# Patient Record
Sex: Female | Born: 1950 | Race: White | Marital: Married | Smoking: Never smoker
Health system: Southern US, Community
[De-identification: ages and names within clinical notes are randomized; demographics above are authoritative.]

## PROBLEM LIST (undated history)

## (undated) DIAGNOSIS — E7800 Pure hypercholesterolemia, unspecified: Secondary | ICD-10-CM

## (undated) DIAGNOSIS — F419 Anxiety disorder, unspecified: Secondary | ICD-10-CM

## (undated) DIAGNOSIS — F32A Depression, unspecified: Secondary | ICD-10-CM

## (undated) DIAGNOSIS — F329 Major depressive disorder, single episode, unspecified: Secondary | ICD-10-CM

## (undated) DIAGNOSIS — K219 Gastro-esophageal reflux disease without esophagitis: Secondary | ICD-10-CM

## (undated) HISTORY — DX: Anxiety disorder, unspecified: F41.9

## (undated) HISTORY — PX: BREAST LUMPECTOMY: SHX2

## (undated) HISTORY — DX: Major depressive disorder, single episode, unspecified: F32.9

## (undated) HISTORY — DX: Pure hypercholesterolemia, unspecified: E78.00

## (undated) HISTORY — PX: COLONOSCOPY: SHX174

## (undated) HISTORY — PX: BREAST CYST EXCISION: SHX579

## (undated) HISTORY — PX: BREAST EXCISIONAL BIOPSY: SUR124

## (undated) HISTORY — DX: Depression, unspecified: F32.A

## (undated) HISTORY — DX: Gastro-esophageal reflux disease without esophagitis: K21.9

## (undated) HISTORY — PX: HEEL SPUR SURGERY: SHX665

---

## 2007-07-14 ENCOUNTER — Ambulatory Visit: Payer: Self-pay | Admitting: Family Medicine

## 2011-10-28 LAB — HM HEPATITIS C SCREENING LAB: HM Hepatitis Screen: NEGATIVE

## 2011-10-29 ENCOUNTER — Ambulatory Visit: Payer: Self-pay | Admitting: Family Medicine

## 2011-10-30 ENCOUNTER — Ambulatory Visit: Payer: Self-pay | Admitting: Family Medicine

## 2012-09-16 ENCOUNTER — Ambulatory Visit: Payer: Self-pay | Admitting: Gastroenterology

## 2012-09-16 HISTORY — PX: UPPER GI ENDOSCOPY: SHX6162

## 2012-09-16 LAB — HM COLONOSCOPY: HM COLON: NORMAL

## 2012-09-17 LAB — PATHOLOGY REPORT

## 2013-01-12 ENCOUNTER — Ambulatory Visit: Payer: Self-pay | Admitting: Family Medicine

## 2013-04-06 ENCOUNTER — Ambulatory Visit: Payer: Self-pay | Admitting: Podiatry

## 2013-04-20 ENCOUNTER — Ambulatory Visit (INDEPENDENT_AMBULATORY_CARE_PROVIDER_SITE_OTHER): Payer: BC Managed Care – PPO | Admitting: Podiatry

## 2013-04-20 ENCOUNTER — Encounter: Payer: Self-pay | Admitting: Podiatry

## 2013-04-20 ENCOUNTER — Ambulatory Visit (INDEPENDENT_AMBULATORY_CARE_PROVIDER_SITE_OTHER): Payer: BC Managed Care – PPO

## 2013-04-20 VITALS — BP 106/66 | HR 61 | Resp 16 | Ht 61.0 in | Wt 111.0 lb

## 2013-04-20 DIAGNOSIS — M79671 Pain in right foot: Secondary | ICD-10-CM

## 2013-04-20 DIAGNOSIS — M79609 Pain in unspecified limb: Secondary | ICD-10-CM

## 2013-04-20 DIAGNOSIS — M2040 Other hammer toe(s) (acquired), unspecified foot: Secondary | ICD-10-CM

## 2013-04-20 NOTE — Progress Notes (Signed)
   Subjective:    Patient ID: Melanie Palmer, female    DOB: Nov 01, 1950, 62 y.o.   MRN: [id number]  HPI Comments: N toenails are falling off and bruising , hammer toes  L all toenails have fallen off, growing back , b/l  #2 toes  D started exercising on treadmill in march  O gradual  C better  A T`went up a shoes size      Review of Systems  HENT:       Sinus problems   Skin:       Change in nails   All other systems reviewed and are negative.       Objective:   Physical Exam: I have reviewed her past medical history medications and allergies. Vital signs are stable she is alert and oriented x3. Vascular evaluation demonstrates strong palpable pulses bilateral. Deep tendon reflexes are intact bilateral. Neurologic sensorium is intact bilateral. Orthopedic evaluation does demonstrate short stubby hammertoe deformities. Cutaneous evaluation does demonstrate nail dystrophies in nail avulsions associated with trauma.       Assessment & Plan:  Assessment: Hammertoe deformity is resulting in nail dystrophies and onychomycosis.  Plan: We discussed appropriate size shoe gear and nail trimming options. I will followup with her on an as-needed basis.

## 2014-06-01 ENCOUNTER — Ambulatory Visit: Payer: Self-pay | Admitting: Family Medicine

## 2014-07-11 LAB — TSH: TSH: 2.58 u[IU]/mL (ref 0.41–5.90)

## 2014-07-11 LAB — HEPATIC FUNCTION PANEL
ALT: 14 U/L (ref 7–35)
AST: 15 U/L (ref 13–35)
Alkaline Phosphatase: 90 U/L (ref 25–125)
Bilirubin, Total: 0.3 mg/dL

## 2014-07-11 LAB — LIPID PANEL
Cholesterol: 248 mg/dL — AB (ref 0–200)
HDL: 52 mg/dL (ref 35–70)
LDL Cholesterol: 166 mg/dL
LDL/HDL RATIO: 3.2
Triglycerides: 152 mg/dL (ref 40–160)

## 2014-07-11 LAB — BASIC METABOLIC PANEL
BUN: 12 mg/dL (ref 4–21)
Creatinine: 0.8 mg/dL (ref 0.5–1.1)
GLUCOSE: 98 mg/dL
POTASSIUM: 4.1 mmol/L (ref 3.4–5.3)
SODIUM: 143 mmol/L (ref 137–147)

## 2014-07-11 LAB — CBC AND DIFFERENTIAL
HEMATOCRIT: 37 % (ref 36–46)
Hemoglobin: 12.8 g/dL (ref 12.0–16.0)
NEUTROS ABS: 2 /uL
Platelets: 294 10*3/uL (ref 150–399)
WBC: 4.5 10^3/mL

## 2015-05-03 DIAGNOSIS — F330 Major depressive disorder, recurrent, mild: Secondary | ICD-10-CM | POA: Insufficient documentation

## 2015-05-03 DIAGNOSIS — F411 Generalized anxiety disorder: Secondary | ICD-10-CM | POA: Insufficient documentation

## 2015-05-03 DIAGNOSIS — K219 Gastro-esophageal reflux disease without esophagitis: Secondary | ICD-10-CM | POA: Insufficient documentation

## 2015-05-03 DIAGNOSIS — F410 Panic disorder [episodic paroxysmal anxiety] without agoraphobia: Secondary | ICD-10-CM | POA: Insufficient documentation

## 2015-05-03 DIAGNOSIS — E039 Hypothyroidism, unspecified: Secondary | ICD-10-CM | POA: Insufficient documentation

## 2015-05-03 DIAGNOSIS — G4700 Insomnia, unspecified: Secondary | ICD-10-CM | POA: Insufficient documentation

## 2015-05-03 DIAGNOSIS — G4730 Sleep apnea, unspecified: Secondary | ICD-10-CM | POA: Insufficient documentation

## 2015-05-03 DIAGNOSIS — E785 Hyperlipidemia, unspecified: Secondary | ICD-10-CM | POA: Insufficient documentation

## 2015-05-03 DIAGNOSIS — J309 Allergic rhinitis, unspecified: Secondary | ICD-10-CM | POA: Insufficient documentation

## 2015-05-10 ENCOUNTER — Ambulatory Visit (INDEPENDENT_AMBULATORY_CARE_PROVIDER_SITE_OTHER): Payer: Medicare Other | Admitting: Family Medicine

## 2015-05-10 ENCOUNTER — Encounter: Payer: Self-pay | Admitting: Family Medicine

## 2015-05-10 VITALS — BP 128/62 | HR 80 | Temp 98.6°F | Resp 16 | Ht 60.0 in | Wt 133.0 lb

## 2015-05-10 DIAGNOSIS — Z1239 Encounter for other screening for malignant neoplasm of breast: Secondary | ICD-10-CM | POA: Diagnosis not present

## 2015-05-10 DIAGNOSIS — Z0000 Encounter for general adult medical examination without abnormal findings: Secondary | ICD-10-CM

## 2015-05-10 DIAGNOSIS — Z1211 Encounter for screening for malignant neoplasm of colon: Secondary | ICD-10-CM

## 2015-05-10 DIAGNOSIS — Z124 Encounter for screening for malignant neoplasm of cervix: Secondary | ICD-10-CM

## 2015-05-10 LAB — IFOBT (OCCULT BLOOD): IMMUNOLOGICAL FECAL OCCULT BLOOD TEST: NEGATIVE

## 2015-05-10 NOTE — Progress Notes (Signed)
Patient ID: Melanie Palmer Huyser, female   DOB: [birth year], 64 y.o.   MRN: [id number]       Patient: Melanie Palmer, Female    DOB: [birth year], 64 y.o.   MRN: [id number] Visit Date: 05/10/2015  Today's Provider: Megan Mansichard  Jr, MD   Chief Complaint  Patient presents with  . Annual Exam   Subjective:    Annual wellness visit Melanie Palmer Hollister is a 64 y.o. female. She feels well. She reports exercising 3 days a week. She reports she is sleeping well. Patient reports that she sleeps on average 6-8 hours a night.  Last: Colonoscopy- 09/16/2012. Repeat in 5 years.   Mammogram- 01/12/2013. Normal ` Prevnar- 07/06/2012  Tdap- 10/28/2011  Zoster- 10/28/2011.  -----------------------------------------------------------   Review of Systems  Constitutional: Negative.   HENT: Negative.   Eyes: Negative.   Respiratory: Negative.   Cardiovascular: Negative.   Gastrointestinal: Negative.   Endocrine: Negative.   Genitourinary: Negative.   Musculoskeletal: Negative.   Skin: Negative.   Allergic/Immunologic: Negative.   Neurological: Negative.   Hematological: Negative.   Psychiatric/Behavioral: Negative.     Social History   Social History  . Marital Status: Married    Spouse Name: N/A  . Number of Children: N/A  . Years of Education: N/A   Occupational History  . Not on file.   Social History Main Topics  . Smoking status: Never Smoker   . Smokeless tobacco: Never Used  . Alcohol Use: No  . Drug Use: No  . Sexual Activity: Not on file   Other Topics Concern  . Not on file   Social History Narrative    Patient Active Problem List   Diagnosis Date Noted  . Allergic rhinitis 05/03/2015  . Insomnia, persistent 05/03/2015  . Anxiety, generalized 05/03/2015  . Acid reflux 05/03/2015  . HLD (hyperlipidemia) 05/03/2015  . Adult hypothyroidism 05/03/2015  . Mild episode of recurrent major depressive disorder (HCC) 05/03/2015  . Panic attack 05/03/2015  . Apnea, sleep  05/03/2015    Past Surgical History  Procedure Laterality Date  . Heel spur surgery    . Breast lumpectomy Left   . Cesarean section    . Upper gi endoscopy  09/16/12    normal, no H Pylori, no need to repeat of EGD.    Her family history includes Congestive Heart Failure in her father; Heart disease in her father, sister, and son; Hypertension in her mother; Melanoma in her mother.    Previous Medications   CLONAZEPAM (KLONOPIN) 0.5 MG TABLET    Take 0.5 mg by mouth 2 (two) times daily. Reported on 05/10/2015   DESVENLAFAXINE (PRISTIQ) 50 MG 24 HR TABLET    Take 50 mg by mouth daily. Reported on 05/10/2015   HYDROXYZINE (ATARAX/VISTARIL) 25 MG TABLET    Take by mouth. Reported on 05/10/2015   HYDROXYZINE (VISTARIL) 25 MG CAPSULE    Take 25 mg by mouth daily.   MISC NATURAL PRODUCTS PO    Take by mouth. Reported on 05/10/2015   OMEPRAZOLE (PRILOSEC) 20 MG CAPSULE    Take by mouth.   SERTRALINE (ZOLOFT) 100 MG TABLET    Take by mouth.   TRAZODONE (DESYREL) 50 MG TABLET    Take 50 mg by mouth at bedtime.    Patient Care Team: Maple Hudsonichard L  Jr., MD as PCP - General (Family Medicine)     Objective:   Vitals: BP 128/62 mmHg  Pulse 80  Temp(Src) 98.6 F (37 C)  Resp 16  Ht  5' (1.524 m)  Wt 133 lb (60.328 kg)  BMI 25.97 kg/m2  Physical Exam  Constitutional: She is oriented to person, place, and time. She appears well-developed and well-nourished.  HENT:  Head: Normocephalic and atraumatic.  Right Ear: External ear normal.  Left Ear: External ear normal.  Nose: Nose normal.  Mouth/Throat: Oropharynx is clear and moist.  Eyes: Conjunctivae and EOM are normal. Pupils are equal, round, and reactive to light.  Neck: Normal range of motion. Neck supple.  Cardiovascular: Normal rate, regular rhythm, normal heart sounds and intact distal pulses.   Pulmonary/Chest: Effort normal and breath sounds normal.  Abdominal: Soft. Bowel sounds are normal.  Genitourinary: Vagina normal  and uterus normal. Guaiac negative stool. No vaginal discharge found.  Musculoskeletal: Normal range of motion.  Neurological: She is alert and oriented to person, place, and time. She has normal reflexes.  Skin: Skin is warm and dry.  Psychiatric: She has a normal mood and affect. Her behavior is normal. Judgment and thought content normal.  Nursing note and vitals reviewed.   Activities of Daily Living In your present state of health, do you have any difficulty performing the following activities: 05/10/2015  Hearing? N  Vision? N  Difficulty concentrating or making decisions? N  Walking or climbing stairs? N  Dressing or bathing? N  Doing errands, shopping? N    Fall Risk Assessment Fall Risk  05/10/2015  Falls in the past year? No     Depression Screen PHQ 2/9 Scores 05/10/2015  PHQ - 2 Score 0    Cognitive Testing - 6-CIT  Correct? Score   What year is it? yes 0 0 or 4  What month is it? yes 0 0 or 3  Memorize:    Floyde Parkins,  40,  [address],  [place],      What time is it? (within 1 hour) yes 0 0 or 3  Count backwards from 20 yes 0 0, 2, or 4  Name the months of the year yes 0 0, 2, or 4  Repeat name & address above yes 0 0, 2, 4, 6, 8, or 10       TOTAL SCORE  0/28   Interpretation:  Normal  Normal (0-7) Abnormal (8-28)       Assessment & Plan:     Annual Wellness Visit  Reviewed patient's Family Medical History Reviewed and updated list of patient's medical providers Assessment of cognitive impairment was done Assessed patient's functional ability Established a written schedule for health screening services Health Risk Assessent Completed and Reviewed  Immunization History  Administered Date(s) Administered  . Influenza, High Dose Seasonal PF 02/09/2015  . Pneumococcal Conjugate-13 07/06/2014, 02/09/2015  . Tdap 10/28/2011  . Zoster 10/28/2011    Health Maintenance  Topic Date Due  . Hepatitis C Screening  09-17-1950  . HIV Screening   05/12/1964  . PAP SMEAR  05/12/1970  . MAMMOGRAM  05/13/1999  . DEXA SCAN  05/12/2014  . INFLUENZA VACCINE  12/18/2015  . PNA vac Low Risk Adult (2 of 2 - PPSV23) 02/09/2016  . TETANUS/TDAP  10/27/2021  . COLONOSCOPY  09/17/2022  . ZOSTAVAX  Completed   1. Medicare annual wellness visit, subsequent  2. Cervical cancer screening F/U pending results. Pap every 3-5 years--discussed with pt. - Pap IG (Image Guided)  3. Breast cancer screening - MM Digital Screening; Future  4. Colon cancer screening - IFOBT POC (occult bld, rslt in office)    Discussed health benefits  of physical activity, and encouraged her to engage in regular exercise appropriate for her age and condition.   I have done the exam and reviewed the above chart and it is accurate to the best of my knowledge.  ------------------------------------------------------------------------------------------------------------

## 2015-05-17 LAB — PAP IG (IMAGE GUIDED): PAP SMEAR COMMENT: 0

## 2015-07-16 ENCOUNTER — Ambulatory Visit (INDEPENDENT_AMBULATORY_CARE_PROVIDER_SITE_OTHER): Payer: Medicare Other | Admitting: Family Medicine

## 2015-07-16 VITALS — BP 128/66 | HR 80 | Temp 97.7°F | Resp 16 | Wt 134.0 lb

## 2015-07-16 DIAGNOSIS — F411 Generalized anxiety disorder: Secondary | ICD-10-CM | POA: Diagnosis not present

## 2015-07-16 DIAGNOSIS — K219 Gastro-esophageal reflux disease without esophagitis: Secondary | ICD-10-CM

## 2015-07-16 DIAGNOSIS — E785 Hyperlipidemia, unspecified: Secondary | ICD-10-CM | POA: Diagnosis not present

## 2015-07-16 DIAGNOSIS — E039 Hypothyroidism, unspecified: Secondary | ICD-10-CM | POA: Diagnosis not present

## 2015-07-16 DIAGNOSIS — G4700 Insomnia, unspecified: Secondary | ICD-10-CM | POA: Diagnosis not present

## 2015-07-16 MED ORDER — OMEPRAZOLE 20 MG PO CPDR: 20.0000 mg | DELAYED_RELEASE_CAPSULE | Freq: Every day | ORAL | Status: DC

## 2015-07-16 NOTE — Progress Notes (Signed)
Patient ID: Melanie Palmer, female   DOB: Apr 04, 1951, 65 y.o.   MRN: [id number]   Melanie Palmer  MRN: [id number] DOB: [birth year]  Subjective:  HPI   The patient is a 65 year old female who presents for follow up her chronic conditions.    1. Hyperlipemia It is time for the patient to have her lipids checked.  She is not currently on any medication for lipids.  2. Hypothyroidism, unspecified hypothyroidism type It is time for the patient to have her thyroid test done.  3. Gastroesophageal reflux disease, esophagitis presence not specified The patient has not been on her Omeprazole for about 3 years but would like to restart taking it.  She states she feels that she is having episode of abdominal burning.  She states that this happens about twice weekly.   She does not relate it to any particular foods or activities, states it is random.  4. Generalized anxiety disorder Sees Dr. Maryruth Bun.  She states she is doing well and that Dr. Maryruth Bun wants to wean her off of some of her medicine, but the patient is reluctant.  5. Insomnia The patient states she takes Trazodone nightly for sleep.  If she does not take the medicine she is up all night.   Patient Active Problem List   Diagnosis Date Noted  . Allergic rhinitis 05/03/2015  . Insomnia, persistent 05/03/2015  . Anxiety, generalized 05/03/2015  . Acid reflux 05/03/2015  . HLD (hyperlipidemia) 05/03/2015  . Adult hypothyroidism 05/03/2015  . Mild episode of recurrent major depressive disorder (HCC) 05/03/2015  . Panic attack 05/03/2015  . Apnea, sleep 05/03/2015    No past medical history on file.  Social History   Social History  . Marital Status: Married    Spouse Name: N/A  . Number of Children: N/A  . Years of Education: N/A   Occupational History  . Not on file.   Social History Main Topics  . Smoking status: Never Smoker   . Smokeless tobacco: Never Used  . Alcohol Use: No  . Drug Use: No  . Sexual Activity: Not  on file   Other Topics Concern  . Not on file   Social History Narrative    Outpatient Prescriptions Prior to Visit  Medication Sig Dispense Refill  . clonazePAM (KLONOPIN) 0.5 MG tablet Take 0.5 mg by mouth 2 (two) times daily. Reported on 05/10/2015    . desvenlafaxine (PRISTIQ) 50 MG 24 hr tablet Take 50 mg by mouth daily. Reported on 05/10/2015    . hydrOXYzine (VISTARIL) 25 MG capsule Take 25 mg by mouth daily.    Marland Kitchen MISC NATURAL PRODUCTS PO Take by mouth. Reported on 05/10/2015    . omeprazole (PRILOSEC) 20 MG capsule Take by mouth.    . sertraline (ZOLOFT) 100 MG tablet Take by mouth.    . traZODone (DESYREL) 50 MG tablet Take 50 mg by mouth at bedtime.    . hydrOXYzine (ATARAX/VISTARIL) 25 MG tablet Take by mouth. Reported on 05/10/2015     No facility-administered medications prior to visit.     Allergies  Allergen Reactions  . Sulfa Antibiotics Hives  . Penicillins Rash    Review of Systems  Constitutional: Negative for fever, chills and malaise/fatigue.  Respiratory: Negative for cough, shortness of breath and wheezing.   Cardiovascular: Negative for chest pain, palpitations, orthopnea and leg swelling.  Gastrointestinal: Negative.   Neurological: Negative for dizziness, weakness and headaches.  Endo/Heme/Allergies: Negative.   Psychiatric/Behavioral: Negative for depression, suicidal ideas,  hallucinations, memory loss and substance abuse. The patient is not nervous/anxious and does not have insomnia.    Objective:  BP 128/66 mmHg  Pulse 80  Temp(Src) 97.7 F (36.5 C) (Oral)  Resp 16  Wt 134 lb (60.782 kg)  Physical Exam  Constitutional: She is oriented to person, place, and time and well-developed, well-nourished, and in no distress.  HENT:  Head: Normocephalic and atraumatic.  Right Ear: External ear normal.  Left Ear: External ear normal.  Nose: Nose normal.  Eyes: Conjunctivae are normal.  Neck: Normal range of motion.  Cardiovascular: Normal rate,  regular rhythm and normal heart sounds.   Pulmonary/Chest: Effort normal and breath sounds normal.  Abdominal: Soft.  Neurological: She is alert and oriented to person, place, and time. Gait normal.  Skin: Skin is warm and dry.  Psychiatric: Mood, memory, affect and judgment normal.    Assessment and Plan :  1. Hyperlipemia Patient does have family history of father requiring CABG at age 64. White aggressively treat cholesterol with this history - COMPLETE METABOLIC PANEL WITH GFR - Lipid Panel With LDL/HDL Ratio  2. Hypothyroidism, unspecified hypothyroidism type  - TSH  3. Gastroesophageal reflux disease, esophagitis presence not specified Advised that she try to cut back on frequency of palisade use if  possible - CBC With Differential/Platelet - COMPLETE METABOLIC PANEL WITH GFR - omeprazole (PRILOSEC) 20 MG capsule; Take 1 capsule (20 mg total) by mouth daily.  Dispense: 30 capsule; Refill: 3  4. Generalized anxiety disorder Controlled. Followed by Dr. Maryruth Bun.  5. Insomnia Per Dr. Pleas Patricia MD [facility] 07/16/2015 10:11 AM

## 2015-07-19 DIAGNOSIS — E785 Hyperlipidemia, unspecified: Secondary | ICD-10-CM | POA: Diagnosis not present

## 2015-07-19 DIAGNOSIS — E039 Hypothyroidism, unspecified: Secondary | ICD-10-CM | POA: Diagnosis not present

## 2015-07-19 DIAGNOSIS — K219 Gastro-esophageal reflux disease without esophagitis: Secondary | ICD-10-CM | POA: Diagnosis not present

## 2015-07-20 LAB — CBC WITH DIFFERENTIAL/PLATELET
BASOS ABS: 0.1 10*3/uL (ref 0.0–0.2)
BASOS: 1 %
EOS (ABSOLUTE): 0.1 10*3/uL (ref 0.0–0.4)
Eos: 2 %
HEMOGLOBIN: 12.4 g/dL (ref 11.1–15.9)
Hematocrit: 35.6 % (ref 34.0–46.6)
Immature Grans (Abs): 0 10*3/uL (ref 0.0–0.1)
Immature Granulocytes: 0 %
LYMPHS ABS: 1.7 10*3/uL (ref 0.7–3.1)
Lymphs: 31 %
MCH: 32.3 pg (ref 26.6–33.0)
MCHC: 34.8 g/dL (ref 31.5–35.7)
MCV: 93 fL (ref 79–97)
Monocytes Absolute: 0.5 10*3/uL (ref 0.1–0.9)
Monocytes: 9 %
NEUTROS ABS: 3.1 10*3/uL (ref 1.4–7.0)
Neutrophils: 57 %
PLATELETS: 306 10*3/uL (ref 150–379)
RBC: 3.84 x10E6/uL (ref 3.77–5.28)
RDW: 11.9 % — ABNORMAL LOW (ref 12.3–15.4)
WBC: 5.5 10*3/uL (ref 3.4–10.8)

## 2015-07-20 LAB — COMPREHENSIVE METABOLIC PANEL
A/G RATIO: 1.6 (ref 1.1–2.5)
ALBUMIN: 4.2 g/dL (ref 3.6–4.8)
ALK PHOS: 91 IU/L (ref 39–117)
ALT: 11 IU/L (ref 0–32)
AST: 15 IU/L (ref 0–40)
BILIRUBIN TOTAL: 0.2 mg/dL (ref 0.0–1.2)
BUN / CREAT RATIO: 16 (ref 11–26)
BUN: 12 mg/dL (ref 8–27)
CHLORIDE: 100 mmol/L (ref 96–106)
CO2: 23 mmol/L (ref 18–29)
Calcium: 9.5 mg/dL (ref 8.7–10.3)
Creatinine, Ser: 0.75 mg/dL (ref 0.57–1.00)
GFR calc Af Amer: 96 mL/min/{1.73_m2} (ref >59)
GFR calc non Af Amer: 83 mL/min/{1.73_m2} (ref >59)
GLUCOSE: 101 mg/dL — AB (ref 65–99)
Globulin, Total: 2.7 g/dL (ref 1.5–4.5)
POTASSIUM: 4.7 mmol/L (ref 3.5–5.2)
SODIUM: 140 mmol/L (ref 134–144)
TOTAL PROTEIN: 6.9 g/dL (ref 6.0–8.5)

## 2015-07-20 LAB — LIPID PANEL WITH LDL/HDL RATIO
Cholesterol, Total: 278 mg/dL — ABNORMAL HIGH (ref 100–199)
HDL: 52 mg/dL (ref >39)
LDL Calculated: 196 mg/dL — ABNORMAL HIGH (ref 0–99)
LDL/HDL RATIO: 3.8 ratio — AB (ref 0.0–3.2)
TRIGLYCERIDES: 151 mg/dL — AB (ref 0–149)
VLDL CHOLESTEROL CAL: 30 mg/dL (ref 5–40)

## 2015-07-20 LAB — TSH: TSH: 1.98 u[IU]/mL (ref 0.450–4.500)

## 2015-07-24 ENCOUNTER — Telehealth: Payer: Self-pay | Admitting: Family Medicine

## 2015-07-24 DIAGNOSIS — F410 Panic disorder [episodic paroxysmal anxiety] without agoraphobia: Secondary | ICD-10-CM | POA: Diagnosis not present

## 2015-07-24 DIAGNOSIS — F411 Generalized anxiety disorder: Secondary | ICD-10-CM | POA: Diagnosis not present

## 2015-07-24 DIAGNOSIS — F331 Major depressive disorder, recurrent, moderate: Secondary | ICD-10-CM | POA: Diagnosis not present

## 2015-07-24 MED ORDER — ROSUVASTATIN CALCIUM 5 MG PO TABS: 5.0000 mg | ORAL_TABLET | Freq: Every day | ORAL | Status: DC

## 2015-07-24 NOTE — Telephone Encounter (Signed)
Pt returned Ana's call about her lab results. Thanks TNP

## 2015-07-24 NOTE — Addendum Note (Signed)
Addended by: Miachel RouxALEKSANDROVA, Devlynn Knoff V on: 07/24/2015 11:25 AM   Modules accepted: Orders

## 2015-07-24 NOTE — Telephone Encounter (Signed)
See lab results note-aa

## 2015-08-13 ENCOUNTER — Telehealth: Payer: Self-pay

## 2015-08-13 NOTE — Telephone Encounter (Signed)
lmtcb-need to know when her last mammogram was, order put in in December 2016 but no results-aa

## 2015-08-13 NOTE — Telephone Encounter (Signed)
Spoke with patient she will call and make [facility]

## 2015-08-14 ENCOUNTER — Ambulatory Visit
Admission: RE | Admit: 2015-08-14 | Discharge: 2015-08-14 | Disposition: A | Discharge status: Home / Self Care | Payer: Medicare Other | Source: Ambulatory Visit | Attending: Family Medicine | Admitting: Family Medicine

## 2015-08-14 ENCOUNTER — Ambulatory Visit (INDEPENDENT_AMBULATORY_CARE_PROVIDER_SITE_OTHER): Payer: Medicare Other | Admitting: Family Medicine

## 2015-08-14 VITALS — BP 138/56 | HR 64 | Temp 97.7°F | Resp 16 | Wt 135.0 lb

## 2015-08-14 DIAGNOSIS — E785 Hyperlipidemia, unspecified: Secondary | ICD-10-CM

## 2015-08-14 DIAGNOSIS — Z1239 Encounter for other screening for malignant neoplasm of breast: Secondary | ICD-10-CM | POA: Insufficient documentation

## 2015-08-14 DIAGNOSIS — Z1231 Encounter for screening mammogram for malignant neoplasm of breast: Secondary | ICD-10-CM | POA: Diagnosis not present

## 2015-08-14 DIAGNOSIS — M791 Myalgia, unspecified site: Secondary | ICD-10-CM

## 2015-08-14 NOTE — Progress Notes (Signed)
Patient ID: Melanie BeamsSharon Diveley, female   DOB: October 18, 1950, 65 y.o.   MRN: [id number]   Melanie BeamsSharon Casali  MRN: [id number] DOB: October 18, 1950  Subjective:  HPI   1. Hyperlipemia The patient is a 65 year old female who presents for follow up of her hyperlipidemia.  Her last visit was on 07/16/15 and at that time she was started on Crestor 5 mg and instructed to return in 1 month for follow up labs.  She reports no adverse effects from the medicine.  She does admit that she has body aches but is uncertain if it is from going to the gym or a side effect of the medicine. Her previous Lipids were:   Lab Results  Component Value Date   CHOL 278* 07/19/2015   CHOL 248* 07/11/2014   Lab Results  Component Value Date   HDL 52 07/19/2015   HDL 52 07/11/2014   Lab Results  Component Value Date   LDLCALC 196* 07/19/2015   LDLCALC 166 07/11/2014   Lab Results  Component Value Date   TRIG 151* 07/19/2015   TRIG 152 07/11/2014   No results found for: CHOLHDL No results found for: LDLDIRECT  Patient Active Problem List   Diagnosis Date Noted  . Allergic rhinitis 05/03/2015  . Insomnia, persistent 05/03/2015  . Anxiety, generalized 05/03/2015  . Acid reflux 05/03/2015  . HLD (hyperlipidemia) 05/03/2015  . Adult hypothyroidism 05/03/2015  . Mild episode of recurrent major depressive disorder (HCC) 05/03/2015  . Panic attack 05/03/2015  . Apnea, sleep 05/03/2015    No past medical history on file.  Social History   Social History  . Marital Status: Married    Spouse Name: N/A  . Number of Children: N/A  . Years of Education: N/A   Occupational History  . Not on file.   Social History Main Topics  . Smoking status: Never Smoker   . Smokeless tobacco: Never Used  . Alcohol Use: No  . Drug Use: No  . Sexual Activity: Not on file   Other Topics Concern  . Not on file   Social History Narrative    Outpatient Prescriptions Prior to Visit  Medication Sig Dispense Refill  .  clonazePAM (KLONOPIN) 0.5 MG tablet Take 0.5 mg by mouth 2 (two) times daily. Reported on 05/10/2015    . hydrOXYzine (VISTARIL) 25 MG capsule Take 25 mg by mouth daily.    Marland Kitchen. MISC NATURAL PRODUCTS PO Take by mouth. Reported on 05/10/2015    . omeprazole (PRILOSEC) 20 MG capsule Take 1 capsule (20 mg total) by mouth daily. 30 capsule 3  . rosuvastatin (CRESTOR) 5 MG tablet Take 1 tablet (5 mg total) by mouth daily. 30 tablet 12  . sertraline (ZOLOFT) 100 MG tablet Take by mouth.    . traZODone (DESYREL) 50 MG tablet Take 50 mg by mouth at bedtime.     No facility-administered medications prior to visit.    Allergies  Allergen Reactions  . Sulfa Antibiotics Hives  . Penicillins Rash    Review of Systems  Constitutional: Negative for fever and malaise/fatigue.  Eyes: Negative.   Respiratory: Negative for cough, shortness of breath and wheezing.   Cardiovascular: Negative for chest pain, palpitations, orthopnea and leg swelling.  Musculoskeletal: Positive for myalgias and neck pain (Not a new symptom). Negative for back pain, joint pain and falls.  Neurological: Negative for dizziness, weakness and headaches.  Endo/Heme/Allergies: Negative.   Psychiatric/Behavioral: Negative.    Objective:  BP 138/56 mmHg  Pulse 64  Temp(Src) 97.7 F (36.5 C) (Oral)  Resp 16  Wt 135 lb (61.236 kg)  Physical Exam  Constitutional: She is oriented to person, place, and time and well-developed, well-nourished, and in no distress.  HENT:  Head: Normocephalic and atraumatic.  Right Ear: External ear normal.  Left Ear: External ear normal.  Nose: Nose normal.  Eyes: Pupils are equal, round, and reactive to light.  Neck: Neck supple.  Cardiovascular: Normal rate, regular rhythm and normal heart sounds.   Pulmonary/Chest: Effort normal and breath sounds normal.  Abdominal: Soft.  Neurological: She is alert and oriented to person, place, and time. Gait normal.  Skin: Skin is warm and dry.    Psychiatric: Mood, memory, affect and judgment normal.    Assessment and Plan :  1. Hyperlipemia  - Lipid Panel With LDL/HDL Ratio - Hepatic function panel - CK  2. Myalgia She wishes to avoid statins if possible.  possible low dose Crestor in the future. - CK 3. Chronic anxiety and depression In remission. Doing very well under the treatment of Dr. Maryruth Bun. I have done the exam and reviewed the above chart and it is accurate to the best of my knowledge.  Julieanne Manson MD [facility] 08/14/2015 11:21 AM

## 2015-08-16 DIAGNOSIS — E785 Hyperlipidemia, unspecified: Secondary | ICD-10-CM | POA: Diagnosis not present

## 2015-08-16 DIAGNOSIS — M791 Myalgia: Secondary | ICD-10-CM | POA: Diagnosis not present

## 2015-08-17 LAB — HEPATIC FUNCTION PANEL
ALK PHOS: 83 IU/L (ref 39–117)
ALT: 21 IU/L (ref 0–32)
AST: 28 IU/L (ref 0–40)
Albumin: 4.2 g/dL (ref 3.6–4.8)
BILIRUBIN, DIRECT: 0.05 mg/dL (ref 0.00–0.40)
Bilirubin Total: 0.2 mg/dL (ref 0.0–1.2)
TOTAL PROTEIN: 6.7 g/dL (ref 6.0–8.5)

## 2015-08-17 LAB — CK: CK TOTAL: 205 U/L — AB (ref 24–173)

## 2015-08-17 LAB — LIPID PANEL WITH LDL/HDL RATIO
Cholesterol, Total: 196 mg/dL (ref 100–199)
HDL: 50 mg/dL (ref >39)
LDL Calculated: 121 mg/dL — ABNORMAL HIGH (ref 0–99)
LDl/HDL Ratio: 2.4 ratio units (ref 0.0–3.2)
TRIGLYCERIDES: 127 mg/dL (ref 0–149)
VLDL Cholesterol Cal: 25 mg/dL (ref 5–40)

## 2015-10-23 DIAGNOSIS — F411 Generalized anxiety disorder: Secondary | ICD-10-CM | POA: Diagnosis not present

## 2015-10-23 DIAGNOSIS — F410 Panic disorder [episodic paroxysmal anxiety] without agoraphobia: Secondary | ICD-10-CM | POA: Diagnosis not present

## 2015-10-23 DIAGNOSIS — F331 Major depressive disorder, recurrent, moderate: Secondary | ICD-10-CM | POA: Diagnosis not present

## 2015-11-13 ENCOUNTER — Ambulatory Visit: Payer: Medicare Other | Admitting: Family Medicine

## 2015-11-18 ENCOUNTER — Other Ambulatory Visit: Payer: Self-pay | Admitting: Family Medicine

## 2015-11-19 ENCOUNTER — Encounter: Payer: Self-pay | Admitting: Family Medicine

## 2015-11-19 ENCOUNTER — Ambulatory Visit (INDEPENDENT_AMBULATORY_CARE_PROVIDER_SITE_OTHER): Payer: Medicare Other | Admitting: Family Medicine

## 2015-11-19 VITALS — BP 122/60 | HR 78 | Temp 97.5°F | Resp 18 | Wt 138.0 lb

## 2015-11-19 DIAGNOSIS — F411 Generalized anxiety disorder: Secondary | ICD-10-CM | POA: Diagnosis not present

## 2015-11-19 DIAGNOSIS — E785 Hyperlipidemia, unspecified: Secondary | ICD-10-CM | POA: Diagnosis not present

## 2015-11-19 DIAGNOSIS — K219 Gastro-esophageal reflux disease without esophagitis: Secondary | ICD-10-CM | POA: Diagnosis not present

## 2015-11-19 DIAGNOSIS — F330 Major depressive disorder, recurrent, mild: Secondary | ICD-10-CM

## 2015-11-19 MED ORDER — OMEPRAZOLE 20 MG PO CPDR: 20.0000 mg | DELAYED_RELEASE_CAPSULE | Freq: Every day | ORAL | Status: DC

## 2015-11-19 NOTE — Progress Notes (Signed)
Patient ID: Melanie BeamsSharon Palmer, female   DOB: [birth year], 65 y.o.   MRN: [id number]    Subjective:  HPI GERD- Pt reports that she is here today to discuss GERD. She had tried to cut back on Omeprazole in February but reports that she started having the " weak stomach" feeling again and started taking it again. She only had 3 refills given to her at that time and will need a refill. She has been taking Omeprazole daily.    Lipid/Cholesterol, Follow-up:   Last seen for this 3 months ago.  Management changes since that visit include none. . Last Lipid Panel:    Component Value Date/Time   CHOL 196 08/16/2015 1002   CHOL 248* 07/11/2014   TRIG 127 08/16/2015 1002   HDL 50 08/16/2015 1002   HDL 52 07/11/2014   LDLCALC 121* 08/16/2015 1002   LDLCALC 166 07/11/2014     She reports good compliance with treatment. She is not having side effects.  Current symptoms include none and have been unchanged. Weight trend: stable  Wt Readings from Last 3 Encounters:  11/19/15 138 lb (62.596 kg)  08/14/15 135 lb (61.236 kg)  07/16/15 134 lb (60.782 kg)    -------------------------------------------------------------------    Prior to Admission medications   Medication Sig Start Date End Date Taking? Authorizing Provider  clonazePAM (KLONOPIN) 0.5 MG tablet Take 0.5 mg by mouth 2 (two) times daily. Reported on 05/10/2015   Yes Historical Provider, MD  hydrOXYzine (VISTARIL) 25 MG capsule Take 25 mg by mouth daily.   Yes Historical Provider, MD  omeprazole (PRILOSEC) 20 MG capsule Take 1 capsule (20 mg total) by mouth daily. 07/16/15  Yes Dema Timmons Hulen ShoutsL  Jr., MD  rosuvastatin (CRESTOR) 5 MG tablet Take 1 tablet (5 mg total) by mouth daily. 07/24/15  Yes Kannan Proia Hulen ShoutsL  Jr., MD  sertraline (ZOLOFT) 100 MG tablet Take by mouth.   Yes Historical Provider, MD  traZODone (DESYREL) 50 MG tablet Take 50 mg by mouth at bedtime.   Yes Historical Provider, MD  MISC NATURAL PRODUCTS PO Take by mouth.  Reported on 05/10/2015    Historical Provider, MD  venlafaxine XR (EFFEXOR-XR) 150 MG 24 hr capsule Take 150 mg by mouth daily. 10/05/15   Historical Provider, MD    Patient Active Problem List   Diagnosis Date Noted  . Allergic rhinitis 05/03/2015  . Insomnia, persistent 05/03/2015  . Anxiety, generalized 05/03/2015  . Acid reflux 05/03/2015  . HLD (hyperlipidemia) 05/03/2015  . Adult hypothyroidism 05/03/2015  . Mild episode of recurrent major depressive disorder (HCC) 05/03/2015  . Panic attack 05/03/2015  . Apnea, sleep 05/03/2015    History reviewed. No pertinent past medical history.  Social History   Social History  . Marital Status: Married    Spouse Name: N/A  . Number of Children: N/A  . Years of Education: N/A   Occupational History  . Not on file.   Social History Main Topics  . Smoking status: Never Smoker   . Smokeless tobacco: Never Used  . Alcohol Use: No  . Drug Use: No  . Sexual Activity: Not on file   Other Topics Concern  . Not on file   Social History Narrative    Allergies  Allergen Reactions  . Sulfa Antibiotics Hives  . Penicillins Rash    Review of Systems  Constitutional: Negative.   HENT: Negative.   Eyes: Negative.   Respiratory: Negative.   Cardiovascular: Negative.   Genitourinary: Negative.   Musculoskeletal: Negative.  Skin: Negative.   Neurological: Negative.   Endo/Heme/Allergies: Negative.   Psychiatric/Behavioral: Negative.     Immunization History  Administered Date(s) Administered  . Influenza, High Dose Seasonal PF 02/09/2015  . Pneumococcal Conjugate-13 07/06/2014, 02/09/2015  . Tdap 10/28/2011  . Zoster 10/28/2011   Objective:  BP 122/60 mmHg  Pulse 78  Temp(Src) 97.5 F (36.4 C) (Oral)  Resp 18  Wt 138 lb (62.596 kg)  Physical Exam  Constitutional: She is oriented to person, place, and time and well-developed, well-nourished, and in no distress.  HENT:  Head: Normocephalic and atraumatic.    Right Ear: External ear normal.  Left Ear: External ear normal.  Nose: Nose normal.  Eyes: Conjunctivae are normal.  Neck: Neck supple.  Cardiovascular: Normal rate, regular rhythm and normal heart sounds.   Pulmonary/Chest: Effort normal and breath sounds normal.  Abdominal: Soft.  Neurological: She is alert and oriented to person, place, and time.  Skin: Skin is warm and dry.  Psychiatric: Mood, memory, affect and judgment normal.    Lab Results  Component Value Date   WBC 5.5 07/19/2015   HGB 12.8 07/11/2014   HCT 35.6 07/19/2015   PLT 306 07/19/2015   GLUCOSE 101* 07/19/2015   CHOL 196 08/16/2015   TRIG 127 08/16/2015   HDL 50 08/16/2015   LDLCALC 121* 08/16/2015   TSH 1.980 07/19/2015    CMP     Component Value Date/Time   NA 140 07/19/2015 1033   K 4.7 07/19/2015 1033   CL 100 07/19/2015 1033   CO2 23 07/19/2015 1033   GLUCOSE 101* 07/19/2015 1033   BUN 12 07/19/2015 1033   CREATININE 0.75 07/19/2015 1033   CREATININE 0.8 07/11/2014   CALCIUM 9.5 07/19/2015 1033   PROT 6.7 08/16/2015 1002   ALBUMIN 4.2 08/16/2015 1002   AST 28 08/16/2015 1002   ALT 21 08/16/2015 1002   ALKPHOS 83 08/16/2015 1002   BILITOT 0.2 08/16/2015 1002   GFRNONAA 83 07/19/2015 1033   GFRAA 96 07/19/2015 1033    Assessment and Plan :  1. Gastroesophageal reflux disease, esophagitis presence not specified Go ahead and use daily omeprazole to control reflux symptoms. - omeprazole (PRILOSEC) 20 MG capsule; Take 1 capsule (20 mg total) by mouth daily.  Dispense: 90 capsule; Refill: 3  2. Anxiety, generalized   3. HLD (hyperlipidemia)   4. Mild episode of recurrent major depressive disorder (HCC) Stable, followed by Dr. Maryruth BunKapur.  I have done the exam and reviewed the above chart and it is accurate to the best of my knowledge.  Julieanne Mansonichard  MD [facility] 11/19/2015 9:43 AM

## 2016-01-24 DIAGNOSIS — F410 Panic disorder [episodic paroxysmal anxiety] without agoraphobia: Secondary | ICD-10-CM | POA: Diagnosis not present

## 2016-01-24 DIAGNOSIS — F331 Major depressive disorder, recurrent, moderate: Secondary | ICD-10-CM | POA: Diagnosis not present

## 2016-01-24 DIAGNOSIS — F411 Generalized anxiety disorder: Secondary | ICD-10-CM | POA: Diagnosis not present

## 2016-01-28 DIAGNOSIS — Z23 Encounter for immunization: Secondary | ICD-10-CM | POA: Diagnosis not present

## 2016-02-06 ENCOUNTER — Encounter: Payer: Self-pay | Admitting: Family Medicine

## 2016-02-06 ENCOUNTER — Ambulatory Visit (INDEPENDENT_AMBULATORY_CARE_PROVIDER_SITE_OTHER): Payer: Medicare Other | Admitting: Family Medicine

## 2016-02-06 VITALS — BP 134/64 | HR 72 | Temp 98.4°F | Resp 16 | Wt 138.0 lb

## 2016-02-06 DIAGNOSIS — L237 Allergic contact dermatitis due to plants, except food: Secondary | ICD-10-CM | POA: Diagnosis not present

## 2016-02-06 MED ORDER — METHYLPREDNISOLONE ACETATE 80 MG/ML IJ SUSP: 80.0000 mg | Freq: Once | INTRAMUSCULAR | Status: DC

## 2016-02-06 NOTE — Progress Notes (Signed)
Patient: Melanie Palmer Female    DOB: Apr 15, 1951   65 y.o.   MRN: [id number] Visit Date: 02/06/2016  Today's Provider: Megan Mans, MD   Chief Complaint  Patient presents with  . Poison Oak    Noticed it Saturday   Subjective:    Rash  This is a new problem. The current episode started in the past 7 days. The problem is unchanged. The rash is diffuse. The rash is characterized by itchiness, redness and draining. She was exposed to plant contact. Pertinent negatives include no fatigue or fever. Past treatments include anti-itch cream. The treatment provided mild relief.     Allergies  Allergen Reactions  . Sulfa Antibiotics Hives  . Penicillins Rash     Current Outpatient Prescriptions:  .  clonazePAM (KLONOPIN) 0.5 MG tablet, Take 0.5 mg by mouth 2 (two) times daily. Reported on 05/10/2015, Disp: , Rfl:  .  hydrOXYzine (VISTARIL) 25 MG capsule, Take 25 mg by mouth daily., Disp: , Rfl:  .  MISC NATURAL PRODUCTS PO, Take by mouth. Reported on 05/10/2015, Disp: , Rfl:  .  omeprazole (PRILOSEC) 20 MG capsule, Take 1 capsule (20 mg total) by mouth daily., Disp: 90 capsule, Rfl: 3 .  rosuvastatin (CRESTOR) 5 MG tablet, Take 1 tablet (5 mg total) by mouth daily., Disp: 30 tablet, Rfl: 12 .  sertraline (ZOLOFT) 100 MG tablet, Take by mouth., Disp: , Rfl:  .  traZODone (DESYREL) 50 MG tablet, Take 50 mg by mouth at bedtime., Disp: , Rfl:  .  venlafaxine XR (EFFEXOR-XR) 150 MG 24 hr capsule, Take 150 mg by mouth daily., Disp: , Rfl: 0  Review of Systems  Constitutional: Positive for chills. Negative for activity change, appetite change, diaphoresis, fatigue, fever and unexpected weight change.  Musculoskeletal: Negative.   Skin: Positive for rash. Negative for color change, pallor and wound.    Social History  Substance Use Topics  . Smoking status: Never Smoker  . Smokeless tobacco: Never Used  . Alcohol use No   Objective:   BP 134/64 (BP Location: Right Arm,  Patient Position: Sitting, Cuff Size: Normal)   Pulse 72   Temp 98.4 F (36.9 C) (Oral)   Resp 16   Wt 138 lb (62.6 kg)   BMI 26.95 kg/m   Physical Exam  Constitutional: She is oriented to person, place, and time. She appears well-developed and well-nourished.  Cardiovascular: Normal rate, regular rhythm and normal heart sounds.   Pulmonary/Chest: Effort normal and breath sounds normal.  Neurological: She is alert and oriented to person, place, and time.  Skin: Skin is warm and dry. Rash noted.  Mild rash mainly on arms consistent with Windell Moulding dermatitis  Psychiatric: She has a normal mood and affect. Her behavior is normal. Judgment and thought content normal.      Assessment & Plan:     1. Poison oak dermatitis Will treat with OTC Benadryl and Claritin.  Depo Medrol injection in the office today.  Pt advised to call Monday if no improvement.  May have to consider a prednisone taper at that time.  Just this with patient that prednisone 12 day taper his preferred treatment. She has had side effects of the agitation with prednisone in the past and wishes to do an injection in the office and repeat them in 4-5 days.  - methylPREDNISolone acetate (DEPO-MEDROL) injection 80 mg; Inject 1 mL (80 mg total) into the muscle once.   Patient was seen and  examined by Julieanne Mansonichard Teah Votaw, MD, and note scribed by Kavin LeechLaura Walsh, CMA.        Terina Mcelhinny Wendelyn BreslowGilbert Jr, MD  [facility]

## 2016-02-12 ENCOUNTER — Ambulatory Visit: Payer: Medicare Other | Admitting: Family Medicine

## 2016-02-18 ENCOUNTER — Ambulatory Visit (INDEPENDENT_AMBULATORY_CARE_PROVIDER_SITE_OTHER): Payer: Medicare Other | Admitting: Family Medicine

## 2016-02-18 VITALS — BP 128/64 | HR 72 | Temp 98.0°F | Resp 12 | Wt 139.0 lb

## 2016-02-18 DIAGNOSIS — T63301A Toxic effect of unspecified spider venom, accidental (unintentional), initial encounter: Secondary | ICD-10-CM | POA: Diagnosis not present

## 2016-02-18 DIAGNOSIS — L237 Allergic contact dermatitis due to plants, except food: Secondary | ICD-10-CM | POA: Diagnosis not present

## 2016-02-18 NOTE — Progress Notes (Signed)
Melanie BeamsSharon Palmer  MRN: [id number] DOB: 1950-08-26  Subjective:  HPI  Patient is here for follow up from last visit on 02/06/16, at that time she was diagnosed with poison oak dermatitis. She was given a Depo injection in the office, patient also took Benadryl OTC. Rash is better in some places but some places still blistered up. Has itching and pain present. No fever. She wants to treat the poison oak  now but it is getting better. In general she does not feel sick. Patient Active Problem List   Diagnosis Date Noted  . Allergic rhinitis 05/03/2015  . Insomnia, persistent 05/03/2015  . Anxiety, generalized 05/03/2015  . Acid reflux 05/03/2015  . HLD (hyperlipidemia) 05/03/2015  . Adult hypothyroidism 05/03/2015  . Mild episode of recurrent major depressive disorder (HCC) 05/03/2015  . Panic attack 05/03/2015  . Apnea, sleep 05/03/2015    No past medical history on file.  Social History   Social History  . Marital status: Married    Spouse name: N/A  . Number of children: N/A  . Years of education: N/A   Occupational History  . Not on file.   Social History Main Topics  . Smoking status: Never Smoker  . Smokeless tobacco: Never Used  . Alcohol use No  . Drug use: No  . Sexual activity: Not on file   Other Topics Concern  . Not on file   Social History Narrative  . No narrative on file    Outpatient Encounter Prescriptions as of 02/18/2016  Medication Sig Note  . clonazePAM (KLONOPIN) 0.5 MG tablet Take 0.5 mg by mouth 2 (two) times daily. Reported on 05/10/2015   . hydrOXYzine (VISTARIL) 25 MG capsule Take 25 mg by mouth daily.   Marland Kitchen. MISC NATURAL PRODUCTS PO Take by mouth. Reported on 05/10/2015 05/03/2015: [facility]: [facility]  . omeprazole (PRILOSEC) 20 MG capsule Take 1 capsule (20 mg total) by mouth daily.   . rosuvastatin (CRESTOR) 5 MG tablet Take 1 tablet (5 mg total) by mouth daily.   . sertraline (ZOLOFT) 100 MG tablet Take by  mouth. 05/03/2015: Per Dr. Maryruth BunKapur Received from: [facility]  . traZODone (DESYREL) 50 MG tablet Take 50 mg by mouth at bedtime.   Marland Kitchen. venlafaxine XR (EFFEXOR-XR) 150 MG 24 hr capsule Take 150 mg by mouth daily. 11/19/2015: Received from: External Pharmacy   Facility-Administered Encounter Medications as of 02/18/2016  Medication  . methylPREDNISolone acetate (DEPO-MEDROL) injection 80 mg    Allergies  Allergen Reactions  . Sulfa Antibiotics Hives  . Penicillins Rash    Review of Systems  Constitutional: Positive for chills.  Respiratory: Negative.   Cardiovascular: Negative.   Gastrointestinal: Negative.   Musculoskeletal: Positive for myalgias.  Skin: Positive for itching and rash.   Objective:  BP 128/64   Pulse 72   Temp 98 F (36.7 C)   Resp 12   Wt 139 lb (63 kg)   BMI 27.15 kg/m   Physical Exam  Constitutional: She is well-developed, well-nourished, and in no distress.  Eyes: Conjunctivae are normal. Pupils are equal, round, and reactive to light.  Neck: Normal range of motion. Neck supple.  Skin:  indurated warm about 3 inches by 3 inches area with apparent center clearing on the left lower leg/calf area.    Assessment and Plan :  1. Poison oak dermatitis Better. Will follow. At this time will not treat with Prednisone at this time, discussed possible risks of taking this medication.  2. Spider bite wound, accidental or unintentional, initial encounter With cellulitis. Getting better, will follow for now. Clean areas with soapy water. Call if symptoms worse or not better. Will treat with Doxy at that time.  HPI, Exam and A&P transcribed under direction and in the presence of Julieanne Manson, MD. I have done the exam and reviewed the chart and it is accurate to the best of my knowledge. Julieanne Manson M.D. [facility]

## 2016-02-19 ENCOUNTER — Ambulatory Visit: Payer: Medicare Other | Admitting: Family Medicine

## 2016-03-14 ENCOUNTER — Ambulatory Visit (INDEPENDENT_AMBULATORY_CARE_PROVIDER_SITE_OTHER): Payer: Medicare Other | Admitting: Family Medicine

## 2016-03-14 VITALS — BP 122/56 | HR 74 | Temp 97.8°F | Ht 60.0 in | Wt 140.0 lb

## 2016-03-14 DIAGNOSIS — Z0000 Encounter for general adult medical examination without abnormal findings: Secondary | ICD-10-CM

## 2016-03-14 DIAGNOSIS — Z23 Encounter for immunization: Secondary | ICD-10-CM | POA: Diagnosis not present

## 2016-03-14 NOTE — Patient Instructions (Signed)
Melanie Palmer , Thank you for taking time to come for your Medicare Wellness Visit. I appreciate your ongoing commitment to your health goals. Please review the following plan we discussed and let me know if I can assist you in the future.   These are the goals we discussed: Goals    . Increase water intake          Starting 03/14/16, I will increase my water intake to 8 glasses a day.       This is a list of the screening recommended for you and due dates:  Health Maintenance  Topic Date Due  .  Hepatitis C: One time screening is recommended by Center for Disease Control  (CDC) for  adults born from [birth year] through 1965.   05/26/2016*  . Mammogram  08/13/2017  . Tetanus Vaccine  10/27/2021  . Colon Cancer Screening  09/17/2022  . Flu Shot  Completed  . DEXA scan (bone density measurement)  Completed  . Shingles Vaccine  Completed  . Pneumonia vaccines  Completed  *Topic was postponed. The date shown is not the original due date.   Preventive Care for Adults  A healthy lifestyle and preventive care can promote health and wellness. Preventive health guidelines for adults include the following key practices.  . A routine yearly physical is a good way to check with your health care provider about your health and preventive screening. It is a chance to share any concerns and updates on your health and to receive a thorough exam.  . Visit your dentist for a routine exam and preventive care every 6 months. Brush your teeth twice a day and floss once a day. Good oral hygiene prevents tooth decay and gum disease.  . The frequency of eye exams is based on your age, health, family medical history, use  of contact lenses, and other factors. Follow your health care provider's ecommendations for frequency of eye exams.  . Eat a healthy diet. Foods like vegetables, fruits, whole grains, low-fat dairy products, and lean protein foods contain the nutrients you need without too many calories.  Decrease your intake of foods high in solid fats, added sugars, and salt. Eat the right amount of calories for you. Get information about a proper diet from your health care provider, if necessary.  . Regular physical exercise is one of the most important things you can do for your health. Most adults should get at least 150 minutes of moderate-intensity exercise (any activity that increases your heart rate and causes you to sweat) each week. In addition, most adults need muscle-strengthening exercises on 2 or more days a week.  Silver Sneakers may be a benefit available to you. To determine eligibility, you may visit the website: www.silversneakers.com or contact program at [phone] Mon-Fri between 8AM-8PM.   . Maintain a healthy weight. The body mass index (BMI) is a screening tool to identify possible weight problems. It provides an estimate of body fat based on height and weight. Your health care provider can find your BMI and can help you achieve or maintain a healthy weight.   For adults 20 years and older: ? A BMI below 18.5 is considered underweight. ? A BMI of 18.5 to 24.9 is normal. ? A BMI of 25 to 29.9 is considered overweight. ? A BMI of 30 and above is considered obese.   . Maintain normal blood lipids and cholesterol levels by exercising and minimizing your intake of saturated fat. Eat a balanced diet with  plenty of fruit and vegetables. Blood tests for lipids and cholesterol should begin at age 31 and be repeated every 5 years. If your lipid or cholesterol levels are high, you are over 50, or you are at high risk for heart disease, you may need your cholesterol levels checked more frequently. Ongoing high lipid and cholesterol levels should be treated with medicines if diet and exercise are not working.  . If you smoke, find out from your health care provider how to quit. If you do not use tobacco, please do not start.  . If you choose to drink alcohol, please do not consume  more than 2 drinks per day. One drink is considered to be 12 ounces (355 mL) of beer, 5 ounces (148 mL) of wine, or 1.5 ounces (44 mL) of liquor.  . If you are 77-32 years old, ask your health care provider if you should take aspirin to prevent strokes.  . Use sunscreen. Apply sunscreen liberally and repeatedly throughout the day. You should seek shade when your shadow is shorter than you. Protect yourself by wearing long sleeves, pants, a wide-brimmed hat, and sunglasses year round, whenever you are outdoors.  . Once a month, do a whole body skin exam, using a mirror to look at the skin on your back. Tell your health care provider of new moles, moles that have irregular borders, moles that are larger than a pencil eraser, or moles that have changed in shape or color.

## 2016-03-14 NOTE — Progress Notes (Signed)
Subjective:   Melanie Palmer is a 65 y.o. female who presents for Medicare Annual (Subsequent) preventive examination. Married ,retired Mother of 2,grandmother of 1. Son died of accidental overdose at age 3. Review of Systems:  N/A Cardiac Risk Factors include: advanced age (>53men, >56 women);dyslipidemia     Objective:     Vitals: BP (!) 122/56 (BP Location: Left Arm)   Pulse 74   Temp 97.8 F (36.6 C) (Oral)   Ht 5' (1.524 m)   Wt 140 lb (63.5 kg)   BMI 27.34 kg/m   Body mass index is 27.34 kg/m.   Tobacco History  Smoking Status  . Never Smoker  Smokeless Tobacco  . Never Used     Counseling given: No   History reviewed. No pertinent past medical history. Past Surgical History:  Procedure Laterality Date  . BREAST CYST EXCISION Left 1970's  . BREAST LUMPECTOMY Left   . CESAREAN SECTION    . HEEL SPUR SURGERY    . UPPER GI ENDOSCOPY  09/16/12   normal, no H Pylori, no need to repeat of EGD.   Family History  Problem Relation Age of Onset  . Hypertension Mother   . Melanoma Mother   . Heart disease Father   . Congestive Heart Failure Father   . Heart disease Sister   . Heart disease Son   . Breast cancer Other    History  Sexual Activity  . Sexual activity: Not on file    Outpatient Encounter Prescriptions as of 03/14/2016  Medication Sig  . clonazePAM (KLONOPIN) 0.5 MG tablet Take 0.5 mg by mouth 2 (two) times daily. Reported on 05/10/2015  . hydrOXYzine (VISTARIL) 25 MG capsule Take 25 mg by mouth daily.  Marland Kitchen MISC NATURAL PRODUCTS PO Take by mouth. Reported on 05/10/2015  . omeprazole (PRILOSEC) 20 MG capsule Take 1 capsule (20 mg total) by mouth daily.  . rosuvastatin (CRESTOR) 5 MG tablet Take 1 tablet (5 mg total) by mouth daily.  . sertraline (ZOLOFT) 100 MG tablet Take by mouth.  . traZODone (DESYREL) 50 MG tablet Take 50 mg by mouth at bedtime.  Marland Kitchen venlafaxine XR (EFFEXOR-XR) 150 MG 24 hr capsule Take 150 mg by mouth daily.    Facility-Administered Encounter Medications as of 03/14/2016  Medication  . methylPREDNISolone acetate (DEPO-MEDROL) injection 80 mg    Activities of Daily Living In your present state of health, do you have any difficulty performing the following activities: 03/14/2016 05/10/2015  Hearing? N N  Vision? N N  Difficulty concentrating or making decisions? Y N  Walking or climbing stairs? N N  Dressing or bathing? N N  Doing errands, shopping? N N  Preparing Food and eating ? N -  Using the Toilet? N -  In the past six months, have you accidently leaked urine? N -  Do you have problems with loss of bowel control? N -  Managing your Medications? N -  Managing your Finances? N -  Housekeeping or managing your Housekeeping? N -  Some recent data might be hidden    Patient Care Team: Maple Hudson., MD as PCP - General (Family Medicine) Sallee Lange, MD as Consulting Physician (Ophthalmology) Darliss Ridgel, MD as Referring Physician (Psychiatry)    Assessment:     Exercise Activities and Dietary recommendations Current Exercise Habits: Home exercise routine, Type of exercise: treadmill;strength training/weights;walking, Time (Minutes): > 60, Frequency (Times/Week): 3, Weekly Exercise (Minutes/Week): 0, Intensity: Moderate, Exercise limited by: None identified  Goals    . Increase water intake          Starting 03/14/16, I will increase my water intake to 8 glasses a day.      Fall Risk Fall Risk  03/14/2016 05/10/2015  Falls in the past year? No No   Depression Screen PHQ 2/9 Scores 03/14/2016 05/10/2015  PHQ - 2 Score 0 0     Cognitive Function     6CIT Screen 03/14/2016  What Year? 0 points  What month? 0 points  What time? 0 points  Count back from 20 0 points  Months in reverse 0 points  Repeat phrase 2 points  Total Score 2    Immunization History  Administered Date(s) Administered  . Influenza Whole 01/28/2016  . Influenza, High Dose  Seasonal PF 02/09/2015  . Pneumococcal Conjugate-13 07/06/2014, 02/09/2015  . Pneumococcal Polysaccharide-23 03/14/2016  . Tdap 10/28/2011  . Zoster 10/28/2011   Screening Tests Health Maintenance  Topic Date Due  . Hepatitis C Screening  05/26/2016 (Originally 01/07/1951)  . MAMMOGRAM  08/13/2017  . TETANUS/TDAP  10/27/2021  . COLONOSCOPY  09/17/2022  . INFLUENZA VACCINE  Completed  . DEXA SCAN  Completed  . ZOSTAVAX  Completed  . PNA vac Low Risk Adult  Completed      Plan:  I have personally reviewed and addressed the Medicare Annual Wellness questionnaire and have noted the following in the patient's chart:  A. Medical and social history B. Use of alcohol, tobacco or illicit drugs  C. Current medications and supplements D. Functional ability and status E.  Nutritional status F.  Physical activity G. Advance directives H. List of other physicians I.  Hospitalizations, surgeries, and ER visits in previous 12 months J.  Vitals K. Screenings such as hearing and vision if needed, cognitive and depression L. Referrals and appointments - none  In addition, I have reviewed and discussed with patient certain preventive protocols, quality metrics, and best practice recommendations. A written personalized care plan for preventive services as well as general preventive health recommendations were provided to patient.  See attached scanned questionnaire for additional information.   Signed,  Hyacinth MeekerMckenzie Markoski, LPN Nurse Health Advisor  I have reviewed the information as  put it by [facility] Markoski LPN and was available for any questions or concerns. Julieanne Mansonichard  MD [facility]

## 2016-04-23 DIAGNOSIS — F411 Generalized anxiety disorder: Secondary | ICD-10-CM | POA: Diagnosis not present

## 2016-04-23 DIAGNOSIS — F410 Panic disorder [episodic paroxysmal anxiety] without agoraphobia: Secondary | ICD-10-CM | POA: Diagnosis not present

## 2016-04-23 DIAGNOSIS — F331 Major depressive disorder, recurrent, moderate: Secondary | ICD-10-CM | POA: Diagnosis not present

## 2016-04-28 ENCOUNTER — Ambulatory Visit (INDEPENDENT_AMBULATORY_CARE_PROVIDER_SITE_OTHER): Payer: Medicare Other | Admitting: Family Medicine

## 2016-04-28 VITALS — BP 140/60 | HR 76 | Temp 98.3°F | Resp 18 | Wt 139.0 lb

## 2016-04-28 DIAGNOSIS — J329 Chronic sinusitis, unspecified: Secondary | ICD-10-CM

## 2016-04-28 MED ORDER — DOXYCYCLINE HYCLATE 100 MG PO TABS: 100.0000 mg | ORAL_TABLET | Freq: Two times a day (BID) | ORAL | 1 refills | Status: DC

## 2016-04-28 NOTE — Progress Notes (Signed)
Melanie Palmer  MRN: [id number] DOB: 11/30/1950  Subjective:  HPI  The patient is a 65 year old female who presents for evaluation of possible sinus infection.  She states her symptoms began 1 week ago.  She has had no fever or shortness of breath but she has had head congestion, non productive cough and sore throat.  She has tried over the counter antihistamines, decongestants, and Tylenol.  She has also used saline irrigation and none of which has made any significant improvement.  She did blow out some green phlegm yesterday.  Patient Active Problem List   Diagnosis Date Noted  . Allergic rhinitis 05/03/2015  . Insomnia, persistent 05/03/2015  . Anxiety, generalized 05/03/2015  . Acid reflux 05/03/2015  . HLD (hyperlipidemia) 05/03/2015  . Adult hypothyroidism 05/03/2015  . Mild episode of recurrent major depressive disorder (HCC) 05/03/2015  . Panic attack 05/03/2015  . Apnea, sleep 05/03/2015    No past medical history on file.  Social History   Social History  . Marital status: Married    Spouse name: N/A  . Number of children: N/A  . Years of education: N/A   Occupational History  . Not on file.   Social History Main Topics  . Smoking status: Never Smoker  . Smokeless tobacco: Never Used  . Alcohol use No  . Drug use: No  . Sexual activity: Not on file   Other Topics Concern  . Not on file   Social History Narrative  . No narrative on file    Outpatient Encounter Prescriptions as of 04/28/2016  Medication Sig Note  . clonazePAM (KLONOPIN) 0.5 MG tablet Take 0.5 mg by mouth 2 (two) times daily. Reported on 05/10/2015   . hydrOXYzine (VISTARIL) 25 MG capsule Take 25 mg by mouth daily.   Marland Kitchen. MISC NATURAL PRODUCTS PO Take by mouth. Reported on 05/10/2015 05/03/2015: Received from: [facility]  . omeprazole (PRILOSEC) 20 MG capsule Take 1 capsule (20 mg total) by mouth daily.   . rosuvastatin (CRESTOR) 5 MG tablet Take 1 tablet (5 mg total)  by mouth daily.   . sertraline (ZOLOFT) 100 MG tablet Take by mouth. 05/03/2015: Per Dr. Maryruth BunKapur Received from: [facility]  . traZODone (DESYREL) 50 MG tablet Take 50 mg by mouth at bedtime.   Marland Kitchen. venlafaxine XR (EFFEXOR-XR) 150 MG 24 hr capsule Take 150 mg by mouth daily. 11/19/2015: Received from: External Pharmacy   Facility-Administered Encounter Medications as of 04/28/2016  Medication  . methylPREDNISolone acetate (DEPO-MEDROL) injection 80 mg    Allergies  Allergen Reactions  . Sulfa Antibiotics Hives  . Penicillins Rash    Review of Systems  Constitutional: Positive for chills, diaphoresis and malaise/fatigue. Negative for fever.  HENT: Positive for congestion, ear pain, sinus pain and sore throat. Negative for ear discharge, hearing loss, nosebleeds and tinnitus.   Eyes: Positive for pain, discharge and redness. Negative for blurred vision, double vision and photophobia.  Respiratory: Positive for cough. Negative for hemoptysis, sputum production, shortness of breath and wheezing.   Cardiovascular: Negative for chest pain, palpitations, orthopnea, claudication, leg swelling and PND.  Neurological: Positive for weakness.  Endo/Heme/Allergies: Negative.   Psychiatric/Behavioral: Negative.     Objective:  BP 140/60 (BP Location: Right Arm, Patient Position: Sitting, Cuff Size: Normal)   Pulse 76   Temp 98.3 F (36.8 C) (Oral)   Resp 18   Wt 139 lb (63 kg)   BMI 27.15 kg/m   Physical Exam  Constitutional: She is  oriented to person, place, and time and well-developed, well-nourished, and in no distress.  HENT:  Head: Normocephalic and atraumatic.  Right Ear: External ear normal.  Left Ear: External ear normal.  Nose: Nose normal.  Mouth/Throat: Oropharynx is clear and moist.  Pansinusitis--tneder across frontal/ethmoid/maxillary sinuses.  Eyes: Conjunctivae are normal. Pupils are equal, round, and reactive to light.  Neck: Normal range of motion. Neck  supple.  Cardiovascular: Normal rate, regular rhythm and normal heart sounds.   Pulmonary/Chest: Effort normal and breath sounds normal.  Abdominal: Soft.  Neurological: She is alert and oriented to person, place, and time. Gait normal.  Skin: Skin is warm and dry.  Psychiatric: Mood, memory, affect and judgment normal.    Assessment and Plan :   1. Sinusitis, unspecified chronicity, unspecified location Could be viral but will cover for bacterial infection. - doxycycline (VIBRA-TABS) 100 MG tablet; Take 1 tablet (100 mg total) by mouth 2 (two) times daily.  Dispense: 20 tablet; Refill: 1   HPI, Exam and A&P Transcribed under the direction and in the presence of Julieanne Mansonichard Kaj Vasil, Montez HagemanJr., MD. Electronically Signed: Janey GreaserElena DeSanto, RMA I have done the exam and reviewed the chart and it is accurate to the best of my knowledge. [employer]ist[employer]  technology has been used and  any errors in dictation or transcription are unintentional. Julieanne Mansonichard Abayomi Pattison M.D. [facility]

## 2016-05-26 ENCOUNTER — Ambulatory Visit (INDEPENDENT_AMBULATORY_CARE_PROVIDER_SITE_OTHER): Payer: Medicare Other | Admitting: Family Medicine

## 2016-05-26 ENCOUNTER — Encounter: Payer: Self-pay | Admitting: Family Medicine

## 2016-05-26 VITALS — BP 124/64 | HR 76 | Temp 97.8°F | Resp 18 | Wt 145.0 lb

## 2016-05-26 DIAGNOSIS — F411 Generalized anxiety disorder: Secondary | ICD-10-CM

## 2016-05-26 DIAGNOSIS — E039 Hypothyroidism, unspecified: Secondary | ICD-10-CM

## 2016-05-26 DIAGNOSIS — K219 Gastro-esophageal reflux disease without esophagitis: Secondary | ICD-10-CM

## 2016-05-26 DIAGNOSIS — F330 Major depressive disorder, recurrent, mild: Secondary | ICD-10-CM

## 2016-05-26 DIAGNOSIS — J329 Chronic sinusitis, unspecified: Secondary | ICD-10-CM

## 2016-05-26 DIAGNOSIS — E7800 Pure hypercholesterolemia, unspecified: Secondary | ICD-10-CM | POA: Diagnosis not present

## 2016-05-26 MED ORDER — LEVOFLOXACIN 500 MG PO TABS: 500.0000 mg | ORAL_TABLET | Freq: Every day | ORAL | 0 refills | Status: DC

## 2016-05-26 NOTE — Progress Notes (Signed)
Subjective:  HPI Depression/Anxiety- Pt is here for a 6 month follow up of chronic problems. She reports that she is feeling well other than a lingering sinus infection and cough, but emotionally she is feeling well and taking Venlafaxine, Klonopin, and Sertraline.   Sinusitis- Pt reports that she was in om 04/28/16 for a sinus infection and was treated with Doxy, she had a refill on it and she took both prescriptions. She does not have any color to her nasal mucus except a little bloody.    Lipid/Cholesterol, Follow-up:   Last seen for this 6 months ago.  Management changes since that visit include none. . Last Lipid Panel:    Component Value Date/Time   CHOL 196 08/16/2015 1002   TRIG 127 08/16/2015 1002   HDL 50 08/16/2015 1002   LDLCALC 121 (H) 08/16/2015 1002    She reports good compliance with treatment. She is not having side effects.  Current symptoms include none and have been unchanged. Weight trend: stable Current exercise: 3 times a week.  Wt Readings from Last 3 Encounters:  05/26/16 145 lb (65.8 kg)  04/28/16 139 lb (63 kg)  03/14/16 140 lb (63.5 kg)    -------------------------------------------------------------------   Prior to Admission medications   Medication Sig Start Date End Date Taking? Authorizing Provider  clonazePAM (KLONOPIN) 0.5 MG tablet Take 0.5 mg by mouth 2 (two) times daily. Reported on 05/10/2015   Yes Historical Provider, MD  hydrOXYzine (VISTARIL) 25 MG capsule Take 25 mg by mouth daily.   Yes Historical Provider, MD  MISC NATURAL PRODUCTS PO Take by mouth. Reported on 05/10/2015   Yes Historical Provider, MD  omeprazole (PRILOSEC) 20 MG capsule Take 1 capsule (20 mg total) by mouth daily. 11/19/15  Yes Zaidin Blyden Hulen Shouts., MD  rosuvastatin (CRESTOR) 5 MG tablet Take 1 tablet (5 mg total) by mouth daily. 07/24/15  Yes Malik Paar Hulen Shouts., MD  sertraline (ZOLOFT) 100 MG tablet Take by mouth.   Yes Historical Provider, MD  traZODone  (DESYREL) 50 MG tablet Take 50 mg by mouth at bedtime.   Yes Historical Provider, MD  venlafaxine XR (EFFEXOR-XR) 150 MG 24 hr capsule Take 150 mg by mouth daily. 10/05/15  Yes Historical Provider, MD  doxycycline (VIBRA-TABS) 100 MG tablet Take 1 tablet (100 mg total) by mouth 2 (two) times daily. Patient not taking: Reported on 05/26/2016 04/28/16   Maple Hudson., MD    Patient Active Problem List   Diagnosis Date Noted  . Allergic rhinitis 05/03/2015  . Insomnia, persistent 05/03/2015  . Anxiety, generalized 05/03/2015  . Acid reflux 05/03/2015  . HLD (hyperlipidemia) 05/03/2015  . Adult hypothyroidism 05/03/2015  . Mild episode of recurrent major depressive disorder (HCC) 05/03/2015  . Panic attack 05/03/2015  . Apnea, sleep 05/03/2015    History reviewed. No pertinent past medical history.  Social History   Social History  . Marital status: Married    Spouse name: N/A  . Number of children: N/A  . Years of education: N/A   Occupational History  . Not on file.   Social History Main Topics  . Smoking status: Never Smoker  . Smokeless tobacco: Never Used  . Alcohol use No  . Drug use: No  . Sexual activity: Not on file   Other Topics Concern  . Not on file   Social History Narrative  . No narrative on file    Allergies  Allergen Reactions  . Sulfa Antibiotics Hives  .  Penicillins Rash    Review of Systems  Constitutional: Negative.   HENT: Positive for congestion (and post nasal drainage).   Eyes: Negative.   Respiratory: Positive for cough.   Cardiovascular: Negative.   Gastrointestinal: Negative.   Genitourinary: Negative.   Musculoskeletal: Negative.   Skin: Negative.   Neurological: Negative.   Endo/Heme/Allergies: Negative.   Psychiatric/Behavioral: Negative.     Immunization History  Administered Date(s) Administered  . Influenza Whole 01/28/2016  . Influenza, High Dose Seasonal PF 02/09/2015  . Pneumococcal Conjugate-13 07/06/2014,  02/09/2015  . Pneumococcal Polysaccharide-23 03/14/2016  . Tdap 10/28/2011  . Zoster 10/28/2011    Objective:  BP 124/64 (BP Location: Left Arm, Patient Position: Sitting, Cuff Size: Normal)   Pulse 76   Temp 97.8 F (36.6 C) (Oral)   Resp 18   Wt 145 lb (65.8 kg)   SpO2 97%   BMI 28.32 kg/m   Physical Exam  Constitutional: She is oriented to person, place, and time and well-developed, well-nourished, and in no distress.  HENT:  Head: Normocephalic and atraumatic.  Right Ear: External ear normal.  Left Ear: External ear normal.  Nose: Nose normal.  Mouth/Throat: Oropharynx is clear and moist.  Mild maxillary sinus tenderness  Eyes: Conjunctivae and EOM are normal. Pupils are equal, round, and reactive to light.  Neck: Normal range of motion. Neck supple.  Cardiovascular: Normal rate, regular rhythm, normal heart sounds and intact distal pulses.   Pulmonary/Chest: Effort normal and breath sounds normal.  Musculoskeletal: Normal range of motion.  Neurological: She is alert and oriented to person, place, and time. She has normal reflexes. Gait normal. GCS score is 15.  Skin: Skin is warm and dry.  Psychiatric: Mood, memory, affect and judgment normal.    Lab Results  Component Value Date   WBC 5.5 07/19/2015   HGB 12.8 07/11/2014   HCT 35.6 07/19/2015   PLT 306 07/19/2015   GLUCOSE 101 (H) 07/19/2015   CHOL 196 08/16/2015   TRIG 127 08/16/2015   HDL 50 08/16/2015   LDLCALC 121 (H) 08/16/2015   TSH 1.980 07/19/2015    CMP     Component Value Date/Time   NA 140 07/19/2015 1033   K 4.7 07/19/2015 1033   CL 100 07/19/2015 1033   CO2 23 07/19/2015 1033   GLUCOSE 101 (H) 07/19/2015 1033   BUN 12 07/19/2015 1033   CREATININE 0.75 07/19/2015 1033   CALCIUM 9.5 07/19/2015 1033   PROT 6.7 08/16/2015 1002   ALBUMIN 4.2 08/16/2015 1002   AST 28 08/16/2015 1002   ALT 21 08/16/2015 1002   ALKPHOS 83 08/16/2015 1002   BILITOT 0.2 08/16/2015 1002   GFRNONAA 83  07/19/2015 1033   GFRAA 96 07/19/2015 1033    Assessment and Plan :  1. Anxiety, generalized./Chronic   2. Pure hypercholesterolemia  - Lipid Panel With LDL/HDL Ratio - Comprehensive metabolic panel  3. Sinusitis, unspecified chronicity, unspecified location  - CBC with Differential/Platelet - levofloxacin (LEVAQUIN) 500 MG tablet; Take 1 tablet (500 mg total) by mouth daily.  Dispense: 7 tablet; Refill: 0  4. Mild episode of recurrent major depressive disorder [facility])  Per Dr Maryruth Bun.  5. Gastroesophageal reflux disease, esophagitis presence not specified   6. Adult hypothyroidism  - TSH   HPI, Exam, and A&P Transcribed under the direction and in the presence of Pedram Goodchild L. Wendelyn Breslow, MD  Electronically Signed: Dimas Chyle, CMA I have done the exam and reviewed the above chart and it is accurate  to the best of my knowledge. DentistDragon  technology has been used in this note in any air is in the dictation or transcription are unintentional.  Julieanne Mansonichard  MD [facility] 05/26/2016 8:23 AM

## 2016-06-13 ENCOUNTER — Telehealth: Payer: Self-pay

## 2016-06-13 LAB — COMPREHENSIVE METABOLIC PANEL
ALBUMIN: 4.1 g/dL (ref 3.6–4.8)
ALT: 14 IU/L (ref 0–32)
AST: 17 IU/L (ref 0–40)
Albumin/Globulin Ratio: 1.6 (ref 1.2–2.2)
Alkaline Phosphatase: 77 IU/L (ref 39–117)
BILIRUBIN TOTAL: 0.2 mg/dL (ref 0.0–1.2)
BUN/Creatinine Ratio: 16 (ref 12–28)
BUN: 15 mg/dL (ref 8–27)
CALCIUM: 9.8 mg/dL (ref 8.7–10.3)
CHLORIDE: 103 mmol/L (ref 96–106)
CO2: 25 mmol/L (ref 18–29)
CREATININE: 0.94 mg/dL (ref 0.57–1.00)
GFR calc non Af Amer: 63 mL/min/{1.73_m2} (ref >59)
GFR, EST AFRICAN AMERICAN: 73 mL/min/{1.73_m2} (ref >59)
GLUCOSE: 90 mg/dL (ref 65–99)
Globulin, Total: 2.6 g/dL (ref 1.5–4.5)
Potassium: 5.3 mmol/L — ABNORMAL HIGH (ref 3.5–5.2)
Sodium: 144 mmol/L (ref 134–144)
TOTAL PROTEIN: 6.7 g/dL (ref 6.0–8.5)

## 2016-06-13 LAB — CBC WITH DIFFERENTIAL/PLATELET
BASOS: 1 %
Basophils Absolute: 0.1 10*3/uL (ref 0.0–0.2)
EOS (ABSOLUTE): 0.3 10*3/uL (ref 0.0–0.4)
Eos: 4 %
Hematocrit: 36.5 % (ref 34.0–46.6)
Hemoglobin: 12.2 g/dL (ref 11.1–15.9)
Immature Grans (Abs): 0 10*3/uL (ref 0.0–0.1)
Immature Granulocytes: 0 %
LYMPHS ABS: 2 10*3/uL (ref 0.7–3.1)
LYMPHS: 33 %
MCH: 31.9 pg (ref 26.6–33.0)
MCHC: 33.4 g/dL (ref 31.5–35.7)
MCV: 95 fL (ref 79–97)
MONOS ABS: 0.6 10*3/uL (ref 0.1–0.9)
Monocytes: 10 %
NEUTROS ABS: 3.2 10*3/uL (ref 1.4–7.0)
Neutrophils: 52 %
PLATELETS: 289 10*3/uL (ref 150–379)
RBC: 3.83 x10E6/uL (ref 3.77–5.28)
RDW: 12.6 % (ref 12.3–15.4)
WBC: 6.1 10*3/uL (ref 3.4–10.8)

## 2016-06-13 LAB — LIPID PANEL WITH LDL/HDL RATIO
CHOLESTEROL TOTAL: 182 mg/dL (ref 100–199)
HDL: 50 mg/dL (ref >39)
LDL Calculated: 111 mg/dL — ABNORMAL HIGH (ref 0–99)
LDl/HDL Ratio: 2.2 ratio units (ref 0.0–3.2)
Triglycerides: 106 mg/dL (ref 0–149)
VLDL CHOLESTEROL CAL: 21 mg/dL (ref 5–40)

## 2016-06-13 LAB — TSH: TSH: 2.63 u[IU]/mL (ref 0.450–4.500)

## 2016-06-13 NOTE — Telephone Encounter (Signed)
-----   Message from Maple Hudsonichard L Gilbert Jr., MD sent at 06/13/2016  8:06 AM EST ----- Labs OK

## 2016-06-13 NOTE — Telephone Encounter (Signed)
[id number] 

## 2016-06-13 NOTE — Telephone Encounter (Signed)
Pt advised-aa 

## 2016-07-02 ENCOUNTER — Ambulatory Visit (INDEPENDENT_AMBULATORY_CARE_PROVIDER_SITE_OTHER): Payer: Medicare Other | Admitting: Family Medicine

## 2016-07-02 VITALS — BP 134/62 | HR 70 | Temp 99.7°F | Resp 16 | Wt 144.0 lb

## 2016-07-02 DIAGNOSIS — F411 Generalized anxiety disorder: Secondary | ICD-10-CM

## 2016-07-02 DIAGNOSIS — K219 Gastro-esophageal reflux disease without esophagitis: Secondary | ICD-10-CM | POA: Diagnosis not present

## 2016-07-02 DIAGNOSIS — R6889 Other general symptoms and signs: Secondary | ICD-10-CM

## 2016-07-02 MED ORDER — OSELTAMIVIR PHOSPHATE 75 MG PO CAPS: 75.0000 mg | ORAL_CAPSULE | Freq: Two times a day (BID) | ORAL | 0 refills | Status: DC

## 2016-07-02 NOTE — Progress Notes (Signed)
Melanie Palmer  MRN: [id number] DOB: February 12, 1951  Subjective:  HPI  Patient states she started feeling bad starting on Sunday 06/29/16 with a dry cough and then Monday 06/30/16 developed more symptoms sore throat, more cough, chills, low grade fever, achy, skin sensitivity, body aches, drainage, runny nose, sinus pressure and headache. She has taking Mucinex and decongestant. Patient Active Problem List   Diagnosis Date Noted  . Allergic rhinitis 05/03/2015  . Insomnia, persistent 05/03/2015  . Anxiety, generalized 05/03/2015  . Acid reflux 05/03/2015  . HLD (hyperlipidemia) 05/03/2015  . Adult hypothyroidism 05/03/2015  . Mild episode of recurrent major depressive disorder (HCC) 05/03/2015  . Panic attack 05/03/2015  . Apnea, sleep 05/03/2015    No past medical history on file.  Social History   Social History  . Marital status: Married    Spouse name: N/A  . Number of children: N/A  . Years of education: N/A   Occupational History  . Not on file.   Social History Main Topics  . Smoking status: Never Smoker  . Smokeless tobacco: Never Used  . Alcohol use No  . Drug use: No  . Sexual activity: Not on file   Other Topics Concern  . Not on file   Social History Narrative  . No narrative on file    Outpatient Encounter Prescriptions as of 07/02/2016  Medication Sig Note  . clonazePAM (KLONOPIN) 0.5 MG tablet Take 0.5 mg by mouth 2 (two) times daily. Reported on 05/10/2015   . hydrOXYzine (VISTARIL) 25 MG capsule Take 25 mg by mouth daily.   Marland Kitchen. MISC NATURAL PRODUCTS PO Take by mouth. Reported on 05/10/2015 05/03/2015: Received from: [facility]  . omeprazole (PRILOSEC) 20 MG capsule Take 1 capsule (20 mg total) by mouth daily.   . rosuvastatin (CRESTOR) 5 MG tablet Take 1 tablet (5 mg total) by mouth daily.   . sertraline (ZOLOFT) 100 MG tablet Take by mouth. 05/03/2015: Per Dr. Maryruth BunKapur Received from: [facility]  . traZODone  (DESYREL) 50 MG tablet Take 50 mg by mouth at bedtime.   Marland Kitchen. venlafaxine XR (EFFEXOR-XR) 150 MG 24 hr capsule Take 150 mg by mouth daily. 11/19/2015: Received from: External Pharmacy  . [DISCONTINUED] doxycycline (VIBRA-TABS) 100 MG tablet Take 1 tablet (100 mg total) by mouth 2 (two) times daily. (Patient not taking: Reported on 05/26/2016)   . [DISCONTINUED] levofloxacin (LEVAQUIN) 500 MG tablet Take 1 tablet (500 mg total) by mouth daily.    Facility-Administered Encounter Medications as of 07/02/2016  Medication  . methylPREDNISolone acetate (DEPO-MEDROL) injection 80 mg    Allergies  Allergen Reactions  . Sulfa Antibiotics Hives  . Penicillins Rash    Review of Systems  Constitutional: Positive for chills, fever and malaise/fatigue.  HENT: Positive for congestion, sinus pain and sore throat.   Respiratory: Positive for cough and sputum production.   Cardiovascular: Negative.   Musculoskeletal: Positive for joint pain and myalgias.       Achy sensation, skin sensitivity  Neurological: Positive for headaches.    Objective:  BP 134/62   Pulse 70   Temp 99.7 F (37.6 C)   Resp 16   Wt 144 lb (65.3 kg)   SpO2 99%   BMI 28.12 kg/m   Physical Exam  Constitutional: She is oriented to person, place, and time and well-developed, well-nourished, and in no distress.  HENT:  Head: Normocephalic and atraumatic.  Right Ear: External ear normal.  Left Ear: External ear normal.  Mouth/Throat: Oropharynx  is clear and moist.  Eyes: Conjunctivae are normal. Pupils are equal, round, and reactive to light.  Neck: Normal range of motion. Neck supple.  Cardiovascular: Normal rate, regular rhythm, normal heart sounds and intact distal pulses.   No murmur heard. Pulmonary/Chest: Effort normal and breath sounds normal. No respiratory distress. She has no wheezes.  Neurological: She is alert and oriented to person, place, and time.    Assessment and Plan :  1. Flu-like symptoms Sounds like  Influenza B or just viral infection. We do not have any flu tests to check this for sure, tests are on back order. Do not think patient has a bacterial infection at this time. Will go ahead and treat with Tamiflu. Push fluids, rest, can use Robitussin.   HPI, Exam and A&P transcribed under direction and in the presence of Julieanne Manson, MD. I have done the exam and reviewed the chart and it is accurate to the best of my knowledge. Dentist has been used and  any errors in dictation or transcription are unintentional. Julieanne Manson M.D. [facility]

## 2016-08-11 ENCOUNTER — Other Ambulatory Visit: Payer: Self-pay | Admitting: Family Medicine

## 2016-08-29 ENCOUNTER — Other Ambulatory Visit: Payer: Self-pay | Admitting: Family Medicine

## 2016-08-29 DIAGNOSIS — Z1231 Encounter for screening mammogram for malignant neoplasm of breast: Secondary | ICD-10-CM

## 2016-09-22 ENCOUNTER — Ambulatory Visit
Admission: RE | Admit: 2016-09-22 | Discharge: 2016-09-22 | Disposition: A | Discharge status: Home / Self Care | Payer: Medicare Other | Source: Ambulatory Visit | Attending: Family Medicine | Admitting: Family Medicine

## 2016-09-22 DIAGNOSIS — Z1231 Encounter for screening mammogram for malignant neoplasm of breast: Secondary | ICD-10-CM | POA: Diagnosis not present

## 2016-10-14 ENCOUNTER — Encounter: Payer: Self-pay | Admitting: Family Medicine

## 2016-10-14 ENCOUNTER — Ambulatory Visit (INDEPENDENT_AMBULATORY_CARE_PROVIDER_SITE_OTHER): Payer: Medicare Other | Admitting: Family Medicine

## 2016-10-14 VITALS — BP 140/62 | HR 82 | Temp 98.5°F | Resp 18 | Wt 139.0 lb

## 2016-10-14 DIAGNOSIS — R059 Cough, unspecified: Secondary | ICD-10-CM

## 2016-10-14 DIAGNOSIS — J329 Chronic sinusitis, unspecified: Secondary | ICD-10-CM

## 2016-10-14 DIAGNOSIS — R05 Cough: Secondary | ICD-10-CM

## 2016-10-14 MED ORDER — DOXYCYCLINE HYCLATE 100 MG PO TABS: 100.0000 mg | ORAL_TABLET | Freq: Two times a day (BID) | ORAL | 0 refills | Status: DC

## 2016-10-14 MED ORDER — HYDROCODONE-HOMATROPINE 5-1.5 MG/5ML PO SYRP: 5.0000 mL | ORAL_SOLUTION | Freq: Three times a day (TID) | ORAL | 0 refills | Status: DC | PRN

## 2016-10-14 NOTE — Progress Notes (Signed)
Subjective:  HPI Pt reports that 4 days ago she started feeling bad, Then the next day she woke with headache, coughing and sinus congestion. She says it has now moved into her chest and she is wheezing, and a little short of breath. She is also having post nasal drainage, chills, ear fullness, and non productive cough. She denies any nausea, vomiting, fever or body aches. She does say that when she was walking up stairs today that she felt her knees buckle and she has never felt that before.   Prior to Admission medications   Medication Sig Start Date End Date Taking? Authorizing Provider  clonazePAM (KLONOPIN) 0.5 MG tablet Take 0.5 mg by mouth 2 (two) times daily. Reported on 05/10/2015   Yes [provider]  hydrOXYzine (VISTARIL) 25 MG capsule Take 25 mg by mouth daily.   Yes [provider]  omeprazole (PRILOSEC) 20 MG capsule Take 1 capsule (20 mg total) by mouth daily. 11/19/15  Yes Maple Hudson., MD  rosuvastatin (CRESTOR) 5 MG tablet take 1 tablet by mouth once daily 08/12/16  Yes Maple Hudson., MD  sertraline (ZOLOFT) 100 MG tablet Take by mouth.   Yes [provider]  traZODone (DESYREL) 50 MG tablet Take 50 mg by mouth at bedtime.   Yes [provider]  venlafaxine XR (EFFEXOR-XR) 150 MG 24 hr capsule Take 150 mg by mouth daily. 10/05/15  Yes [provider]  MISC NATURAL PRODUCTS PO Take by mouth. Reported on 05/10/2015    [provider]    Patient Active Problem List   Diagnosis Date Noted  . Allergic rhinitis 05/03/2015  . Insomnia, persistent 05/03/2015  . Anxiety, generalized 05/03/2015  . Acid reflux 05/03/2015  . HLD (hyperlipidemia) 05/03/2015  . Adult hypothyroidism 05/03/2015  . Mild episode of recurrent major depressive disorder (HCC) 05/03/2015  . Panic attack 05/03/2015  . Apnea, sleep 05/03/2015    History reviewed. No pertinent past medical history.  Social History   Social History    . Marital status: Married    Spouse name: N/A  . Number of children: N/A  . Years of education: N/A   Occupational History  . Not on file.   Social History Main Topics  . Smoking status: Never Smoker  . Smokeless tobacco: Never Used  . Alcohol use No  . Drug use: No  . Sexual activity: Not on file   Other Topics Concern  . Not on file   Social History Narrative  . No narrative on file    Allergies  Allergen Reactions  . Sulfa Antibiotics Hives  . Penicillins Rash    Review of Systems  Constitutional: Positive for malaise/fatigue.  HENT: Positive for congestion.        Ear fullness and post nasal drainage.   Eyes: Negative.   Respiratory: Positive for cough, shortness of breath and wheezing.   Cardiovascular: Negative.   Gastrointestinal: Negative.   Genitourinary: Negative.   Musculoskeletal: Negative.   Skin: Negative.   Neurological: Positive for weakness and headaches.  Endo/Heme/Allergies: Negative.   Psychiatric/Behavioral: Negative.     Immunization History  Administered Date(s) Administered  . Influenza Whole 01/28/2016  . Influenza, High Dose Seasonal PF 02/09/2015  . Pneumococcal Conjugate-13 07/06/2014, 02/09/2015  . Pneumococcal Polysaccharide-23 03/14/2016  . Tdap 10/28/2011  . Zoster 10/28/2011    Objective:  BP 140/62 (BP Location: Right Arm, Patient Position: Sitting, Cuff Size: Normal)   Pulse 82   Temp 98.5  F (36.9 C) (Oral)   Resp 18   Wt 139 lb (63 kg)   SpO2 95%   BMI 27.15 kg/m   Physical Exam  Constitutional: She is oriented to person, place, and time and well-developed, well-nourished, and in no distress.  HENT:  Head: Normocephalic and atraumatic.  Right Ear: External ear normal.  Left Ear: External ear normal.  Nose: Nose normal.  Mouth/Throat: Oropharynx is clear and moist.  Maxillary sinuses tender.  Eyes: Conjunctivae are normal. No scleral icterus.  Neck: No thyromegaly present.  Cardiovascular: Normal rate,  regular rhythm and normal heart sounds.   Pulmonary/Chest: Effort normal and breath sounds normal.  Abdominal: Soft.  Neurological: She is alert and oriented to person, place, and time. Gait normal. GCS score is 15.  Skin: Skin is warm and dry.  Psychiatric: Mood, memory, affect and judgment normal.    Lab Results  Component Value Date   WBC 6.1 06/12/2016   HGB 12.8 07/11/2014   HCT 36.5 06/12/2016   PLT 289 06/12/2016   GLUCOSE 90 06/12/2016   CHOL 182 06/12/2016   TRIG 106 06/12/2016   HDL 50 06/12/2016   LDLCALC 111 (H) 06/12/2016   TSH 2.630 06/12/2016    CMP     Component Value Date/Time   NA 144 06/12/2016 0855   K 5.3 (H) 06/12/2016 0855   CL 103 06/12/2016 0855   CO2 25 06/12/2016 0855   GLUCOSE 90 06/12/2016 0855   BUN 15 06/12/2016 0855   CREATININE 0.94 06/12/2016 0855   CALCIUM 9.8 06/12/2016 0855   PROT 6.7 06/12/2016 0855   ALBUMIN 4.1 06/12/2016 0855   AST 17 06/12/2016 0855   ALT 14 06/12/2016 0855   ALKPHOS 77 06/12/2016 0855   BILITOT 0.2 06/12/2016 0855   GFRNONAA 63 06/12/2016 0855   GFRAA 73 06/12/2016 0855    Assessment and Plan :  1. Sinusitis, unspecified chronicity, unspecified location  - HYDROcodone-homatropine (HYCODAN) 5-1.5 MG/5ML syrup; Take 5 mLs by mouth every 8 (eight) hours as needed for cough.  Dispense: 120 mL; Refill: 0 - doxycycline (VIBRA-TABS) 100 MG tablet; Take 1 tablet (100 mg total) by mouth 2 (two) times daily.  Dispense: 20 tablet; Refill: 0  2. Cough  - HYDROcodone-homatropine (HYCODAN) 5-1.5 MG/5ML syrup; Take 5 mLs by mouth every 8 (eight) hours as needed for cough.  Dispense: 120 mL; Refill: 0  I have done the exam and reviewed the chart and it is accurate to the best of my knowledge. DentistDragon  technology has been used and  any errors in dictation or transcription are unintentional. Julieanne Mansonichard Gilbert M.D. [facility]   Julieanne Mansonichard Gilbert MD [facility] 10/14/2016 2:31 PM

## 2016-10-29 DIAGNOSIS — F411 Generalized anxiety disorder: Secondary | ICD-10-CM | POA: Diagnosis not present

## 2016-10-29 DIAGNOSIS — F410 Panic disorder [episodic paroxysmal anxiety] without agoraphobia: Secondary | ICD-10-CM | POA: Diagnosis not present

## 2016-10-29 DIAGNOSIS — F331 Major depressive disorder, recurrent, moderate: Secondary | ICD-10-CM | POA: Diagnosis not present

## 2016-11-13 ENCOUNTER — Other Ambulatory Visit: Payer: Self-pay | Admitting: Family Medicine

## 2016-11-13 DIAGNOSIS — K219 Gastro-esophageal reflux disease without esophagitis: Secondary | ICD-10-CM

## 2016-11-14 ENCOUNTER — Ambulatory Visit (INDEPENDENT_AMBULATORY_CARE_PROVIDER_SITE_OTHER): Payer: Medicare Other

## 2016-11-14 VITALS — BP 124/64 | HR 76 | Temp 98.3°F | Ht 60.0 in | Wt 135.6 lb

## 2016-11-14 DIAGNOSIS — Z0000 Encounter for general adult medical examination without abnormal findings: Secondary | ICD-10-CM

## 2016-11-14 NOTE — Progress Notes (Signed)
Subjective:   Melanie Palmer is a 66 y.o. female who presents for Medicare Annual (Subsequent) preventive examination.  Review of Systems:  N/A  Cardiac Risk Factors include: advanced age (>26men, >43 women);dyslipidemia     Objective:     Vitals: BP 124/64 (BP Location: Right Arm)   Pulse 76   Temp 98.3 F (36.8 C) (Oral)   Ht 5' (1.524 m)   Wt 135 lb 9.6 oz (61.5 kg)   BMI 26.48 kg/m   Body mass index is 26.48 kg/m.   Tobacco History  Smoking Status  . Never Smoker  Smokeless Tobacco  . Never Used     Counseling given: Not Answered   History reviewed. No pertinent past medical history. Past Surgical History:  Procedure Laterality Date  . BREAST CYST EXCISION Left 1970's  . BREAST LUMPECTOMY Left   . CESAREAN SECTION    . HEEL SPUR SURGERY    . UPPER GI ENDOSCOPY  09/16/12   normal, no H Pylori, no need to repeat of EGD.   Family History  Problem Relation Age of Onset  . Hypertension Mother   . Melanoma Mother   . Heart disease Father   . Congestive Heart Failure Father   . Heart disease Sister   . Heart disease Son   . Breast cancer Other    History  Sexual Activity  . Sexual activity: Not on file    Outpatient Encounter Prescriptions as of 11/14/2016  Medication Sig  . clonazePAM (KLONOPIN) 0.5 MG tablet Take 0.5 mg by mouth 2 (two) times daily. Reported on 05/10/2015  . hydrOXYzine (VISTARIL) 25 MG capsule Take 25 mg by mouth daily.  Marland Kitchen loratadine (CLARITIN) 10 MG tablet Take 10 mg by mouth daily as needed for allergies.  Marland Kitchen omeprazole (PRILOSEC) 20 MG capsule take 1 capsule by mouth once daily  . rosuvastatin (CRESTOR) 5 MG tablet take 1 tablet by mouth once daily  . sertraline (ZOLOFT) 100 MG tablet Take by mouth.  . traZODone (DESYREL) 50 MG tablet Take 50 mg by mouth at bedtime.  Marland Kitchen venlafaxine XR (EFFEXOR-XR) 150 MG 24 hr capsule Take 150 mg by mouth daily.  . [DISCONTINUED] doxycycline (VIBRA-TABS) 100 MG tablet Take 1 tablet (100 mg  total) by mouth 2 (two) times daily.  . [DISCONTINUED] HYDROcodone-homatropine (HYCODAN) 5-1.5 MG/5ML syrup Take 5 mLs by mouth every 8 (eight) hours as needed for cough.  . [DISCONTINUED] MISC NATURAL PRODUCTS PO Take by mouth. Reported on 05/10/2015  . [DISCONTINUED] methylPREDNISolone acetate (DEPO-MEDROL) injection 80 mg    No facility-administered encounter medications on file as of 11/14/2016.     Activities of Daily Living In your present state of health, do you have any difficulty performing the following activities: 11/14/2016 03/14/2016  Hearing? N N  Vision? N N  Difficulty concentrating or making decisions? N Y  Walking or climbing stairs? N N  Dressing or bathing? N N  Doing errands, shopping? N N  Preparing Food and eating ? N N  Using the Toilet? N N  In the past six months, have you accidently leaked urine? N N  Do you have problems with loss of bowel control? N N  Managing your Medications? N N  Managing your Finances? N N  Housekeeping or managing your Housekeeping? N N  Some recent data might be hidden    Patient Care Team: Maple Hudson., MD as PCP - General (Family Medicine) Dingeldein, Viviann Spare, MD as Consulting Physician (Ophthalmology) Darliss Ridgel,  MD as Referring Physician (Psychiatry)    Assessment:     Exercise Activities and Dietary recommendations Current Exercise Habits: Structured exercise class, Type of exercise: walking;strength training/weights;treadmill, Time (Minutes): > 60 (3 hours), Frequency (Times/Week): 3, Weekly Exercise (Minutes/Week): 0, Intensity: Moderate  Goals    . Increase water intake          Starting 03/14/16, I will increase my water intake to 8 glasses a day.    . Increase water intake          Continue to increase water intake to 8 glasses a day.      Fall Risk Fall Risk  11/14/2016 03/14/2016 05/10/2015  Falls in the past year? No No No   Depression Screen PHQ 2/9 Scores 11/14/2016 03/14/2016 05/10/2015    PHQ - 2 Score 0 0 0     Cognitive Function     6CIT Screen 03/14/2016  What Year? 0 points  What month? 0 points  What time? 0 points  Count back from 20 0 points  Months in reverse 0 points  Repeat phrase 2 points  Total Score 2    Immunization History  Administered Date(s) Administered  . Influenza Whole 01/28/2016  . Influenza, High Dose Seasonal PF 02/09/2015  . Pneumococcal Conjugate-13 07/06/2014, 02/09/2015  . Pneumococcal Polysaccharide-23 03/14/2016  . Tdap 10/28/2011  . Zoster 10/28/2011   Screening Tests Health Maintenance  Topic Date Due  . INFLUENZA VACCINE  12/17/2016  . MAMMOGRAM  09/23/2018  . TETANUS/TDAP  10/27/2021  . COLONOSCOPY  09/17/2022  . DEXA SCAN  Completed  . Hepatitis C Screening  Completed  . PNA vac Low Risk Adult  Completed      Plan:  I have personally reviewed and addressed the Medicare Annual Wellness questionnaire and have noted the following in the patient's chart:  A. Medical and social history B. Use of alcohol, tobacco or illicit drugs  C. Current medications and supplements D. Functional ability and status E.  Nutritional status F.  Physical activity G. Advance directives H. List of other physicians I.  Hospitalizations, surgeries, and ER visits in previous 12 months J.  Vitals K. Screenings such as hearing and vision if needed, cognitive and depression L. Referrals and appointments - none  In addition, I have reviewed and discussed with patient certain preventive protocols, quality metrics, and best practice recommendations. A written personalized care plan for preventive services as well as general preventive health recommendations were provided to patient.  See attached scanned questionnaire for additional information.   Signed,  Hyacinth MeekerMckenzie Bransen [place], LPN Nurse Health Advisor   MD Recommendations: None.

## 2016-11-14 NOTE — Patient Instructions (Signed)
[name] , Thank you for taking time to come for your Medicare Wellness Visit. I appreciate your ongoing commitment to your health goals. Please review the following plan we discussed and let me know if I can assist you in the future.   Screening recommendations/referrals: Colonoscopy: completed 09/16/12, due 09/2022 Mammogram: completed 09/22/16, due 09/2026 Bone Density: completed 06/01/14 Recommended yearly ophthalmology/optometry visit for glaucoma screening and checkup Recommended yearly dental visit for hygiene and checkup  Vaccinations: Influenza vaccine: due 01/2017 Pneumococcal vaccine: completed series Tdap vaccine: completed 10/28/11, due 10/2021 Shingles vaccine: completed 10/28/11  Advanced directives: Please bring a copy of your POA (Power of ClydeAttorney) and/or Living Will to your next appointment.   Conditions/risks identified: Continue to increase water intake to 8 glasses a day.   Next appointment: 11/24/16 @ 8:00 AM   Preventive Care 68 Years and Older, Female Preventive care refers to lifestyle choices and visits with your health care provider that can promote health and wellness. What does preventive care include?  A yearly physical exam. This is also called an annual well check.  Dental exams once or twice a year.  Routine eye exams. Ask your health care provider how often you should have your eyes checked.  Personal lifestyle choices, including:  Daily care of your teeth and gums.  Regular physical activity.  Eating a healthy diet.  Avoiding tobacco and drug use.  Limiting alcohol use.  Practicing safe sex.  Taking low-dose aspirin every day.  Taking vitamin and mineral supplements as recommended by your health care provider. What happens during an annual well check? The services and screenings done by your health care provider during your annual well check will depend on your age, overall health, lifestyle risk factors, and family history of  disease. Counseling  Your health care provider may ask you questions about your:  Alcohol use.  Tobacco use.  Drug use.  Emotional well-being.  Home and relationship well-being.  Sexual activity.  Eating habits.  History of falls.  Memory and ability to understand (cognition).  Work and work Astronomerenvironment.  Reproductive health. Screening  You may have the following tests or measurements:  Height, weight, and BMI.  Blood pressure.  Lipid and cholesterol levels. These may be checked every 5 years, or more frequently if you are over 68 years old.  Skin check.  Lung cancer screening. You may have this screening every year starting at age 68 if you have a 30-pack-year history of smoking and currently smoke or have quit within the past 15 years.  Fecal occult blood test (FOBT) of the stool. You may have this test every year starting at age 68.  Flexible sigmoidoscopy or colonoscopy. You may have a sigmoidoscopy every 5 years or a colonoscopy every 10 years starting at age 68.  Hepatitis C blood test.  Hepatitis B blood test.  Sexually transmitted disease (STD) testing.  Diabetes screening. This is done by checking your blood sugar (glucose) after you have not eaten for a while (fasting). You may have this done every 1-3 years.  Bone density scan. This is done to screen for osteoporosis. You may have this done starting at age 68.  Mammogram. This may be done every 1-2 years. Talk to your health care provider about how often you should have regular mammograms. Talk with your health care provider about your test results, treatment options, and if necessary, the need for more tests. Vaccines  Your health care provider may recommend certain vaccines, such as:  Influenza  vaccine. This is recommended every year.  Tetanus, diphtheria, and acellular pertussis (Tdap, Td) vaccine. You may need a Td booster every 10 years.  Zoster vaccine. You may need this after age  68.  Pneumococcal 13-valent conjugate (PCV13) vaccine. One dose is recommended after age 1.  Pneumococcal polysaccharide (PPSV23) vaccine. One dose is recommended after age 68. Talk to your health care provider about which screenings and vaccines you need and how often you need them. This information is not intended to replace advice given to you by your health care provider. Make sure you discuss any questions you have with your health care provider. Document Released: 06/01/2015 Document Revised: 01/23/2016 Document Reviewed: 03/06/2015 Elsevier Interactive Patient Education  2017 [place] Prevention in the Home Falls can cause injuries. They can happen to people of all ages. There are many things you can do to make your home safe and to help prevent falls. What can I do on the outside of my home?  Regularly fix the edges of walkways and driveways and fix any cracks.  Remove anything that might make you trip as you walk through a door, such as a raised step or threshold.  Trim any bushes or trees on the path to your home.  Use bright outdoor lighting.  Clear any walking paths of anything that might make someone trip, such as rocks or tools.  Regularly check to see if handrails are loose or broken. Make sure that both sides of any steps have handrails.  Any raised decks and porches should have guardrails on the edges.  Have any leaves, snow, or ice cleared regularly.  Use sand or salt on walking paths during winter.  Clean up any spills in your garage right away. This includes oil or grease spills. What can I do in the bathroom?  Use night lights.  Install grab bars by the toilet and in the tub and shower. Do not use towel bars as grab bars.  Use non-skid mats or decals in the tub or shower.  If you need to sit down in the shower, use a plastic, non-slip stool.  Keep the floor dry. Clean up any water that spills on the floor as soon as it happens.  Remove  soap buildup in the tub or shower regularly.  Attach bath mats securely with double-sided non-slip rug tape.  Do not have throw rugs and other things on the floor that can make you trip. What can I do in the bedroom?  Use night lights.  Make sure that you have a light by your bed that is easy to reach.  Do not use any sheets or blankets that are too big for your bed. They should not hang down onto the floor.  Have a firm chair that has side arms. You can use this for support while you get dressed.  Do not have throw rugs and other things on the floor that can make you trip. What can I do in the kitchen?  Clean up any spills right away.  Avoid walking on wet floors.  Keep items that you use a lot in easy-to-reach places.  If you need to reach something above you, use a strong step stool that has a grab bar.  Keep electrical cords out of the way.  Do not use floor polish or wax that makes floors slippery. If you must use wax, use non-skid floor wax.  Do not have throw rugs and other things on the floor that can  make you trip. What can I do with my stairs?  Do not leave any items on the stairs.  Make sure that there are handrails on both sides of the stairs and use them. Fix handrails that are broken or loose. Make sure that handrails are as long as the stairways.  Check any carpeting to make sure that it is firmly attached to the stairs. Fix any carpet that is loose or worn.  Avoid having throw rugs at the top or bottom of the stairs. If you do have throw rugs, attach them to the floor with carpet tape.  Make sure that you have a light switch at the top of the stairs and the bottom of the stairs. If you do not have them, ask someone to add them for you. What else can I do to help prevent falls?  Wear shoes that:  Do not have high heels.  Have rubber bottoms.  Are comfortable and fit you well.  Are closed at the toe. Do not wear sandals.  If you use a  stepladder:  Make sure that it is fully opened. Do not climb a closed stepladder.  Make sure that both sides of the stepladder are locked into place.  Ask someone to hold it for you, if possible.  Clearly mark and make sure that you can see:  Any grab bars or handrails.  First and last steps.  Where the edge of each step is.  Use tools that help you move around (mobility aids) if they are needed. These include:  Canes.  Walkers.  Scooters.  Crutches.  Turn on the lights when you go into a dark area. Replace any light bulbs as soon as they burn out.  Set up your furniture so you have a clear path. Avoid moving your furniture around.  If any of your floors are uneven, fix them.  If there are any pets around you, be aware of where they are.  Review your medicines with your doctor. Some medicines can make you feel dizzy. This can increase your chance of falling. Ask your doctor what other things that you can do to help prevent falls. This information is not intended to replace advice given to you by your health care provider. Make sure you discuss any questions you have with your health care provider. Document Released: 03/01/2009 Document Revised: 10/11/2015 Document Reviewed: 06/09/2014 Elsevier Interactive Patient Education  2017 [employer].

## 2016-11-24 ENCOUNTER — Ambulatory Visit (INDEPENDENT_AMBULATORY_CARE_PROVIDER_SITE_OTHER): Payer: Medicare Other | Admitting: Family Medicine

## 2016-11-24 ENCOUNTER — Encounter: Payer: Self-pay | Admitting: Family Medicine

## 2016-11-24 VITALS — BP 116/62 | HR 80 | Temp 97.7°F | Resp 16 | Wt 134.0 lb

## 2016-11-24 DIAGNOSIS — E039 Hypothyroidism, unspecified: Secondary | ICD-10-CM

## 2016-11-24 DIAGNOSIS — R42 Dizziness and giddiness: Secondary | ICD-10-CM | POA: Diagnosis not present

## 2016-11-24 DIAGNOSIS — F411 Generalized anxiety disorder: Secondary | ICD-10-CM

## 2016-11-24 DIAGNOSIS — E7800 Pure hypercholesterolemia, unspecified: Secondary | ICD-10-CM

## 2016-11-24 DIAGNOSIS — K219 Gastro-esophageal reflux disease without esophagitis: Secondary | ICD-10-CM

## 2016-11-24 DIAGNOSIS — F330 Major depressive disorder, recurrent, mild: Secondary | ICD-10-CM | POA: Diagnosis not present

## 2016-11-24 NOTE — Patient Instructions (Signed)
Stay hydrated and be careful turning fast.

## 2016-11-24 NOTE — Progress Notes (Signed)
Subjective:  HPI  Lipid/Cholesterol, Follow-up:   Last seen for this 6 months ago.  Management changes since that visit include none. . Last Lipid Panel:    Component Value Date/Time   CHOL 182 06/12/2016 0855   TRIG 106 06/12/2016 0855   HDL 50 06/12/2016 0855   LDLCALC 111 (H) 06/12/2016 0855    She reports good compliance with treatment. She is not having side effects.  Current exercise: walking and weightlifting 3 days a week.  Wt Readings from Last 3 Encounters:  11/24/16 134 lb (60.8 kg)  11/14/16 135 lb 9.6 oz (61.5 kg)  10/14/16 139 lb (63 kg)    ------------------------------------------------------------------- Pt reports that she is feeling well emotionally. She has noticed that she is getting dizzy/lightheaded sometimes. When she is standing and when she turns around too fast. She is doing a herbal life diet and has lost 15 pounds. Denies any chest pain, shortness of breath, palpitations or sweats. She reports that it did happen a few times while working in the yard over the weekend.   Prior to Admission medications   Medication Sig Start Date End Date Taking? Authorizing Provider  clonazePAM (KLONOPIN) 0.5 MG tablet Take 0.5 mg by mouth 2 (two) times daily. Reported on 05/10/2015    [provider]  hydrOXYzine (VISTARIL) 25 MG capsule Take 25 mg by mouth daily.    [provider]  loratadine (CLARITIN) 10 MG tablet Take 10 mg by mouth daily as needed for allergies.    [provider]  omeprazole (PRILOSEC) 20 MG capsule take 1 capsule by mouth once daily 11/14/16   Maple HudsonGilbert, Richard L Jr., MD  rosuvastatin (CRESTOR) 5 MG tablet take 1 tablet by mouth once daily 08/12/16   Maple HudsonGilbert, Richard L Jr., MD  sertraline (ZOLOFT) 100 MG tablet Take by mouth.    [provider]  traZODone (DESYREL) 50 MG tablet Take 50 mg by mouth at bedtime.    [provider]  venlafaxine XR (EFFEXOR-XR) 150 MG 24 hr capsule Take 150 mg by  mouth daily. 10/05/15   [provider]    Patient Active Problem List   Diagnosis Date Noted  . Allergic rhinitis 05/03/2015  . Insomnia, persistent 05/03/2015  . Anxiety, generalized 05/03/2015  . Acid reflux 05/03/2015  . HLD (hyperlipidemia) 05/03/2015  . Adult hypothyroidism 05/03/2015  . Mild episode of recurrent major depressive disorder (HCC) 05/03/2015  . Panic attack 05/03/2015  . Apnea, sleep 05/03/2015    History reviewed. No pertinent past medical history.  Social History   Social History  . Marital status: Married    Spouse name: N/A  . Number of children: N/A  . Years of education: N/A   Occupational History  . Not on file.   Social History Main Topics  . Smoking status: Never Smoker  . Smokeless tobacco: Never Used  . Alcohol use No  . Drug use: No  . Sexual activity: Not on file   Other Topics Concern  . Not on file   Social History Narrative  . No narrative on file    Allergies  Allergen Reactions  . Sulfa Antibiotics Hives  . Penicillins Rash    Review of Systems  Constitutional: Negative.   HENT: Negative.   Eyes: Negative.   Respiratory: Negative.   Cardiovascular: Negative.   Gastrointestinal: Negative.   Genitourinary: Negative.   Musculoskeletal: Negative.   Skin: Negative.   Neurological: Positive for dizziness.  Endo/Heme/Allergies: Negative.   Psychiatric/Behavioral: Negative.  Immunization History  Administered Date(s) Administered  . Influenza Whole 01/28/2016  . Influenza, High Dose Seasonal PF 02/09/2015  . Pneumococcal Conjugate-13 07/06/2014, 02/09/2015  . Pneumococcal Polysaccharide-23 03/14/2016  . Tdap 10/28/2011  . Zoster 10/28/2011    Objective:  BP 116/62 (BP Location: Left Arm, Patient Position: Sitting, Cuff Size: Normal)   Pulse 80   Temp 97.7 F (36.5 C) (Oral)   Resp 16   Wt 134 lb (60.8 kg)   BMI 26.17 kg/m   Physical Exam  Constitutional: She is oriented to person, place, and  time and well-developed, well-nourished, and in no distress.  HENT:  Head: Normocephalic and atraumatic.  Right Ear: External ear normal.  Left Ear: External ear normal.  Nose: Nose normal.  Eyes: Conjunctivae and EOM are normal. Pupils are equal, round, and reactive to light.  Neck: Normal range of motion. Neck supple.  Cardiovascular: Normal rate, regular rhythm, normal heart sounds and intact distal pulses.   Pulmonary/Chest: Effort normal and breath sounds normal.  Abdominal: Soft.  Musculoskeletal: Normal range of motion.  Neurological: She is alert and oriented to person, place, and time. She has normal reflexes. Gait normal. GCS score is 15.  Mild bilateral nystagmus.  Skin: Skin is warm and dry.  Psychiatric: Mood, memory, affect and judgment normal.    Lab Results  Component Value Date   WBC 6.1 06/12/2016   HGB 12.2 06/12/2016   HCT 36.5 06/12/2016   PLT 289 06/12/2016   GLUCOSE 90 06/12/2016   CHOL 182 06/12/2016   TRIG 106 06/12/2016   HDL 50 06/12/2016   LDLCALC 111 (H) 06/12/2016   TSH 2.630 06/12/2016    CMP     Component Value Date/Time   NA 144 06/12/2016 0855   K 5.3 (H) 06/12/2016 0855   CL 103 06/12/2016 0855   CO2 25 06/12/2016 0855   GLUCOSE 90 06/12/2016 0855   BUN 15 06/12/2016 0855   CREATININE 0.94 06/12/2016 0855   CALCIUM 9.8 06/12/2016 0855   PROT 6.7 06/12/2016 0855   ALBUMIN 4.1 06/12/2016 0855   AST 17 06/12/2016 0855   ALT 14 06/12/2016 0855   ALKPHOS 77 06/12/2016 0855   BILITOT 0.2 06/12/2016 0855   GFRNONAA 63 06/12/2016 0855   GFRAA 73 06/12/2016 0855    Assessment and Plan :  1. Gastroesophageal reflux disease, esophagitis presence not specified PPOI necessary per pt.  2. Pure hypercholesterolemia   3. Adult hypothyroidism   4. Dizziness/Mild vertigo vs some dehydration Hydrate for now. - EKG 12-Lead  5. Anxiety, generalized   6. Mild episode of recurrent major depressive disorder (HCC) In remission--per Dr  Maryruth Bun.   HPI, Exam, and A&P Transcribed under the direction and in the presence of Richard L. Wendelyn Breslow, MD  Electronically Signed: Silvio Pate, CMA I have done the exam and reviewed the above chart and it is accurate to the best of my knowledge. Dentist has been used in this note in any air is in the dictation or transcription are unintentional.    Julieanne Manson MD [facility] 11/24/2016 8:24 AM

## 2017-02-04 DIAGNOSIS — F331 Major depressive disorder, recurrent, moderate: Secondary | ICD-10-CM | POA: Diagnosis not present

## 2017-02-04 DIAGNOSIS — F411 Generalized anxiety disorder: Secondary | ICD-10-CM | POA: Diagnosis not present

## 2017-02-04 DIAGNOSIS — F410 Panic disorder [episodic paroxysmal anxiety] without agoraphobia: Secondary | ICD-10-CM | POA: Diagnosis not present

## 2017-02-16 ENCOUNTER — Ambulatory Visit (INDEPENDENT_AMBULATORY_CARE_PROVIDER_SITE_OTHER): Payer: Medicare Other | Admitting: Family Medicine

## 2017-02-16 VITALS — BP 124/52 | HR 82 | Temp 98.3°F | Resp 14 | Wt 132.2 lb

## 2017-02-16 DIAGNOSIS — N368 Other specified disorders of urethra: Secondary | ICD-10-CM | POA: Diagnosis not present

## 2017-02-16 DIAGNOSIS — N3000 Acute cystitis without hematuria: Secondary | ICD-10-CM

## 2017-02-16 DIAGNOSIS — Z23 Encounter for immunization: Secondary | ICD-10-CM

## 2017-02-16 MED ORDER — DOXYCYCLINE HYCLATE 100 MG PO TABS: 100.0000 mg | ORAL_TABLET | Freq: Two times a day (BID) | ORAL | 0 refills | Status: DC

## 2017-02-16 NOTE — Progress Notes (Addendum)
Melanie Palmer  MRN: [id number] DOB: 1950/09/16  Subjective:  HPI  Patient states yesterday she was at church all day and has noticed vaginal pressure. Then this morning she went to use the bathroom and felt pressure, " did not seem normal in the vaginal area". She put her right index finger in her vagina and felt something in the way and she was bale to push it up. There was some blood today. She did have some urinary leakage yesterday too. She has never had hysterectomy. Last pap smear 05/10/15 normal.  Patient Active Problem List   Diagnosis Date Noted  . Allergic rhinitis 05/03/2015  . Insomnia, persistent 05/03/2015  . Anxiety, generalized 05/03/2015  . Acid reflux 05/03/2015  . HLD (hyperlipidemia) 05/03/2015  . Adult hypothyroidism 05/03/2015  . Mild episode of recurrent major depressive disorder (HCC) 05/03/2015  . Panic attack 05/03/2015  . Apnea, sleep 05/03/2015    No past medical history on file.  Social History   Social History  . Marital status: Married    Spouse name: N/A  . Number of children: N/A  . Years of education: N/A   Occupational History  . Not on file.   Social History Main Topics  . Smoking status: Never Smoker  . Smokeless tobacco: Never Used  . Alcohol use No  . Drug use: No  . Sexual activity: Not on file   Other Topics Concern  . Not on file   Social History Narrative  . No narrative on file    Outpatient Encounter Prescriptions as of 02/16/2017  Medication Sig Note  . clonazePAM (KLONOPIN) 0.5 MG tablet Take 0.5 mg by mouth 2 (two) times daily. Reported on 05/10/2015   . hydrOXYzine (VISTARIL) 25 MG capsule Take 25 mg by mouth daily.   Marland Kitchen loratadine (CLARITIN) 10 MG tablet Take 10 mg by mouth daily as needed for allergies.   Marland Kitchen omeprazole (PRILOSEC) 20 MG capsule take 1 capsule by mouth once daily   . rosuvastatin (CRESTOR) 5 MG tablet take 1 tablet by mouth once daily   . sertraline (ZOLOFT) 100 MG tablet Take by mouth.  05/03/2015: Per Dr. Maryruth Bun Received from: [facility]  . traZODone (DESYREL) 50 MG tablet Take 50 mg by mouth at bedtime.   Marland Kitchen venlafaxine XR (EFFEXOR-XR) 150 MG 24 hr capsule Take 150 mg by mouth daily. 11/19/2015: Received from: External Pharmacy   No facility-administered encounter medications on file as of 02/16/2017.     Allergies  Allergen Reactions  . Sulfa Antibiotics Hives  . Penicillins Rash    Review of Systems  Constitutional: Negative.   Respiratory: Negative.   Cardiovascular: Negative.   Genitourinary:       Urinary leakage, vaginal pressure.  Musculoskeletal: Negative.     Objective:  BP (!) 124/52   Pulse 82   Temp 98.3 F (36.8 C)   Resp 14   Wt 132 lb 3.2 oz (60 kg)   BMI 25.82 kg/m   Physical Exam  Constitutional: She is oriented to person, place, and time and well-developed, well-nourished, and in no distress.  HENT:  Head: Normocephalic and atraumatic.  Cardiovascular: Normal rate and regular rhythm.   Abdominal: Soft.  Genitourinary: No vaginal discharge found.  Genitourinary Comments: Erythema present, swelling of/around urethral meatus.  Neurological: She is alert and oriented to person, place, and time. Gait normal. GCS score is 15.  Skin: Skin is warm and dry.  Psychiatric: Mood, memory, affect and judgment normal.    Assessment  and Plan :  1. Irritation of urethral meatus Treat with Doxy and get Urine culture. Re check in 1 week and re do vaginal exam. If symptoms are not better will refer to specialist at that time. May need to see urology. Consider Topical premarin on f/u visit. - doxycycline (VIBRA-TABS) 100 MG tablet; Take 1 tablet (100 mg total) by mouth 2 (two) times daily.  Dispense: 14 tablet; Refill: 0 - Urine Culture  2. Acute cystitis without hematuria - doxycycline (VIBRA-TABS) 100 MG tablet; Take 1 tablet (100 mg total) by mouth 2 (two) times daily.  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 I have done the exam and reviewed the chart and it is accurate to the best of my knowledge. Dentist has been used and  any errors in dictation or transcription are unintentional. Julieanne Manson M.D. [facility]

## 2017-02-17 LAB — URINE CULTURE
MICRO NUMBER:: 81085379
SPECIMEN QUALITY:: ADEQUATE

## 2017-02-25 ENCOUNTER — Ambulatory Visit (INDEPENDENT_AMBULATORY_CARE_PROVIDER_SITE_OTHER): Payer: Medicare Other | Admitting: Family Medicine

## 2017-02-25 VITALS — BP 136/62 | HR 76 | Temp 98.4°F | Resp 14 | Wt 133.0 lb

## 2017-02-25 DIAGNOSIS — N368 Other specified disorders of urethra: Secondary | ICD-10-CM | POA: Diagnosis not present

## 2017-02-25 MED ORDER — ESTROGENS, CONJUGATED 0.625 MG/GM VA CREA: 1.0000 | TOPICAL_CREAM | Freq: Every day | VAGINAL | 12 refills | Status: DC

## 2017-02-25 NOTE — Progress Notes (Signed)
Melanie Palmer  MRN: [id number] DOB: 1950/09/06  Subjective:  HPI   The patient is a 66 year old female who presents for follow up after  Being seen for irritation of the urethral meatus, acute cystitis and possible urethral prolapse.  She was last seen on 02/16/17.  She was given Doxycycline and instructed to return today.  Discussed with the patient that we will consider topical premarin cream and possible referral to specialist.  The patient states she is doing a little better.  She is not having as much pain.  The patient feels that something is falling down in her vaginal area.  She also noticed today that there was some blood on the paper after she urinated.  She has not had that since the first or second day of her symptoms.   Patient Active Problem List   Diagnosis Date Noted  . Allergic rhinitis 05/03/2015  . Insomnia, persistent 05/03/2015  . Anxiety, generalized 05/03/2015  . Acid reflux 05/03/2015  . HLD (hyperlipidemia) 05/03/2015  . Adult hypothyroidism 05/03/2015  . Mild episode of recurrent major depressive disorder (HCC) 05/03/2015  . Panic attack 05/03/2015  . Apnea, sleep 05/03/2015    No past medical history on file.  Social History   Social History  . Marital status: Married    Spouse name: N/A  . Number of children: N/A  . Years of education: N/A   Occupational History  . Not on file.   Social History Main Topics  . Smoking status: Never Smoker  . Smokeless tobacco: Never Used  . Alcohol use No  . Drug use: No  . Sexual activity: Not on file   Other Topics Concern  . Not on file   Social History Narrative  . No narrative on file    Outpatient Encounter Prescriptions as of 02/25/2017  Medication Sig Note  . clonazePAM (KLONOPIN) 0.5 MG tablet Take 0.5 mg by mouth 2 (two) times daily. Reported on 05/10/2015   . doxycycline (VIBRA-TABS) 100 MG tablet Take 1 tablet (100 mg total) by mouth 2 (two) times daily.   . hydrOXYzine (VISTARIL) 25 MG  capsule Take 25 mg by mouth daily.   Marland Kitchen loratadine (CLARITIN) 10 MG tablet Take 10 mg by mouth daily as needed for allergies.   Marland Kitchen omeprazole (PRILOSEC) 20 MG capsule take 1 capsule by mouth once daily   . rosuvastatin (CRESTOR) 5 MG tablet take 1 tablet by mouth once daily   . sertraline (ZOLOFT) 100 MG tablet Take by mouth. 05/03/2015: Per Dr. Maryruth Bun Received from: [facility]  . traZODone (DESYREL) 50 MG tablet Take 50 mg by mouth at bedtime.   Marland Kitchen venlafaxine XR (EFFEXOR-XR) 150 MG 24 hr capsule Take 150 mg by mouth daily. 11/19/2015: Received from: External Pharmacy   No facility-administered encounter medications on file as of 02/25/2017.     Allergies  Allergen Reactions  . Sulfa Antibiotics Hives  . Penicillins Rash    Review of Systems  Constitutional: Negative for fever and malaise/fatigue.  Respiratory: Negative for cough, shortness of breath and wheezing.   Cardiovascular: Negative for chest pain, palpitations, orthopnea, claudication and leg swelling.  Genitourinary: Negative for dysuria, frequency, hematuria and urgency.  Neurological: Negative for weakness.    Objective:  BP 136/62 (BP Location: Right Arm, Patient Position: Sitting, Cuff Size: Normal)   Pulse 76   Temp 98.4 F (36.9 C) (Oral)   Resp 14   Wt 133 lb (60.3 kg)   BMI 25.97 kg/m  Physical Exam  Constitutional: She is oriented to person, place, and time and well-developed, well-nourished, and in no distress.  HENT:  Head: Normocephalic and atraumatic.  Neck: No thyromegaly present.  Cardiovascular: Normal rate, regular rhythm and normal heart sounds.   Pulmonary/Chest: Effort normal and breath sounds normal.  Abdominal: Soft.  Genitourinary:  Genitourinary Comments: Peri urethral area has almost returned to normal.  Neurological: She is alert and oriented to person, place, and time.  Skin: Skin is warm and dry.  Psychiatric: Mood, memory, affect and judgment normal.    Assessment  and Plan :  UTI Urethral Prolapse Refer to Gyn. Topical Premarin cream nightly pending evaluation.  I have done the exam and reviewed the chart and it is accurate to the best of my knowledge. Dentist has been used and  any errors in dictation or transcription are unintentional. Julieanne Manson M.D. [facility]

## 2017-03-10 ENCOUNTER — Ambulatory Visit (INDEPENDENT_AMBULATORY_CARE_PROVIDER_SITE_OTHER): Payer: Medicare Other | Admitting: Obstetrics and Gynecology

## 2017-03-10 ENCOUNTER — Encounter: Payer: Self-pay | Admitting: Obstetrics and Gynecology

## 2017-03-10 VITALS — BP 124/65 | HR 83 | Ht 60.0 in | Wt 130.8 lb

## 2017-03-10 DIAGNOSIS — N362 Urethral caruncle: Secondary | ICD-10-CM | POA: Diagnosis not present

## 2017-03-10 DIAGNOSIS — N952 Postmenopausal atrophic vaginitis: Secondary | ICD-10-CM | POA: Diagnosis not present

## 2017-03-10 NOTE — Patient Instructions (Signed)
1.  Apply Premarin cream 1/2-1 g intravaginally twice a week 2.  Return in 6 months for follow-up  Atrophic Vaginitis Atrophic vaginitis is a condition in which the tissues that line the vagina become dry and thin. This condition is most common in women who have stopped having regular menstrual periods (menopause). This usually starts when a woman is 5945-68 years old. Estrogen helps to keep the vagina moist. It stimulates the vagina to produce a clear fluid that lubricates the vagina for sexual intercourse. This fluid also protects the vagina from infection. Lack of estrogen can cause the lining of the vagina to get thinner and dryer. The vagina may also shrink in size. It may become less elastic. Atrophic vaginitis tends to get worse over time as a woman's estrogen level drops. What are the causes? This condition is caused by the normal drop in estrogen that happens around the time of menopause. What increases the risk? Certain conditions or situations may lower a woman's estrogen level, which increases her risk of atrophic vaginitis. These include:  Taking medicine that blocks estrogen.  Having ovaries removed surgically.  Being treated for cancer with X-ray treatment (radiation) or medicines (chemotherapy).  Exercising very hard and often.  Having an eating disorder (anorexia).  Giving birth or breastfeeding.  Being over the age of 68.  Smoking.  What are the signs or symptoms? Symptoms of this condition include:  Pain, soreness, or bleeding during sexual intercourse (dyspareunia).  Vaginal burning, irritation, or itching.  Pain or bleeding during a vaginal examination using a speculum (pelvic exam).  Loss of interest in sexual activity.  Having burning pain when passing urine.  Vaginal discharge that is brown or yellow.  In some cases, there are no symptoms. How is this diagnosed? This condition is diagnosed with a medical history and physical exam. This will include a  pelvic exam that checks whether the inside of your vagina appears pale, thin, or dry. Rarely, you may also have other tests, including:  A urine test.  A test that checks the acid balance in your vaginal fluid (acid balance test).  How is this treated? Treatment for this condition may depend on the severity of your symptoms. Treatment may include:  Using an over-the-counter vaginal lubricant before you have sexual intercourse.  Using a long-acting vaginal moisturizer.  Using low-dose vaginal estrogen for moderate to severe symptoms that do not respond to other treatments. Options include creams, tablets, and inserts (vaginal rings). Before using vaginal estrogen, tell your health care provider if you have a history of: ? Breast cancer. ? Endometrial cancer. ? Blood clots.  Taking medicines. You may be able to take a daily pill for dyspareunia. Discuss all of the risks of this medicine with your health care provider. It is usually not recommended for women who have a family history or personal history of breast cancer.  If your symptoms are very mild and you are not sexually active, you may not need treatment. Follow these instructions at home:  Take medicines only as directed by your health care provider. Do not use herbal or alternative medicines unless your health care provider says that you can.  Use over-the-counter creams, lubricants, or moisturizers for dryness only as directed by your health care provider.  If your atrophic vaginitis is caused by menopause, discuss all of your menopausal symptoms and treatment options with your health care provider.  Do not douche.  Do not use products that can make your vagina dry. These include: ?  Scented feminine sprays. ? Scented tampons. ? Scented soaps.  If it hurts to have sex, talk with your sexual partner. Contact a health care provider if:  Your discharge looks different than normal.  Your vagina has an unusual smell.  You  have new symptoms.  Your symptoms do not improve with treatment.  Your symptoms get worse. This information is not intended to replace advice given to you by your health care provider. Make sure you discuss any questions you have with your health care provider. Document Released: 09/19/2014 Document Revised: 10/11/2015 Document Reviewed: 04/26/2014 Elsevier Interactive Patient Education  [employer].

## 2017-03-10 NOTE — Progress Notes (Signed)
GYN ENCOUNTER NOTE  Subjective:       Melanie Palmer is a 66 y.o. [id number] female is here for gynecologic evaluation of the following issues:  1.  Pelvic organ prolapse.    66 year old married white female para 2001, history of cesarean section x2, presents in referral from Dr. Sullivan LoneGilbert for evaluation of possible pelvic organ prolapse.  She was seen in early October by Dr. Sullivan LoneGilbert for evaluation of pelvic pressure, and faint bleeding from the vagina x1 with an associated bulge noted at the introitus; was noted to have possible urethral prolapse and urethritis.  She was treated with doxycycline antibiotic.  2 weeks later at follow-up the urethral meatus appeared to be returning to normal without inflammation present.  Because of the pelvic pressure and this previously noted abnormal finding on clinical exam, she was referred for further evaluation.  GU history: Urinary frequency-8/day Nocturia-none No history of SUI No significant history of urge  GI history: No history of chronic constipation No history of splinting for bowel movements   Gynecologic History No LMP recorded. Patient is postmenopausal. Menarche-age 32 Menopause-age 64 No prior history of HRT use No history of abnormal Pap smears No GYN surgery Not currently sexually active with intercourse History of faint vaginal spotting times 1 day; resolved with antibiotic therapy without recurrence History of cesarean section x2 One child is deceased from suicide secondary to PTSD from [place] exposure  Obstetric History OB History  Gravida Para Term Preterm AB Living  2 2 2     2   SAB TAB Ectopic Multiple Live Births          2    # Outcome Date GA Lbr Len/2nd Weight Sex Delivery Anes PTL Lv  2 Term 1983   5 lb 2.1 oz (2.327 kg) M CS-LTranv   LIV  1 Term 1979   6 lb 4.8 oz (2.858 kg) F CS-LTranv   LIV      Past Medical History:  Diagnosis Date  . Anxiety   . Depression   . GERD (gastroesophageal reflux  disease)   . Hypercholesteremia     Past Surgical History:  Procedure Laterality Date  . BREAST CYST EXCISION Left 1970's  . BREAST LUMPECTOMY Left   . CESAREAN SECTION    . HEEL SPUR SURGERY    . UPPER GI ENDOSCOPY  09/16/12   normal, no H Pylori, no need to repeat of EGD.    Current Outpatient Prescriptions on File Prior to Visit  Medication Sig Dispense Refill  . clonazePAM (KLONOPIN) 0.5 MG tablet Take 0.5 mg by mouth 2 (two) times daily. Reported on 05/10/2015    . conjugated estrogens (PREMARIN) vaginal cream Place 1 Applicatorful vaginally daily. 42.5 g 12  . hydrOXYzine (VISTARIL) 25 MG capsule Take 25 mg by mouth daily.    Marland Kitchen. loratadine (CLARITIN) 10 MG tablet Take 10 mg by mouth daily as needed for allergies.    Marland Kitchen. omeprazole (PRILOSEC) 20 MG capsule take 1 capsule by mouth once daily 90 capsule 3  . rosuvastatin (CRESTOR) 5 MG tablet take 1 tablet by mouth once daily 90 tablet 3  . sertraline (ZOLOFT) 100 MG tablet Take by mouth.    . traZODone (DESYREL) 50 MG tablet Take 50 mg by mouth at bedtime.    Marland Kitchen. venlafaxine XR (EFFEXOR-XR) 150 MG 24 hr capsule Take 150 mg by mouth daily.  0   No current facility-administered medications on file prior to visit.     Allergies  Allergen Reactions  . Sulfa Antibiotics Hives  . Penicillins Rash    Social History   Social History  . Marital status: Married    Spouse name: N/A  . Number of children: N/A  . Years of education: N/A   Occupational History  . Not on file.   Social History Main Topics  . Smoking status: Never Smoker  . Smokeless tobacco: Never Used  . Alcohol use No  . Drug use: No  . Sexual activity: Not Currently    Birth control/ protection: Post-menopausal   Other Topics Concern  . Not on file   Social History Narrative  . No narrative on file    Family History  Problem Relation Age of Onset  . Hypertension Mother   . Melanoma Mother   . Heart disease Father   . Congestive Heart Failure Father    . Heart disease Sister   . Heart disease Son   . Breast cancer Other     The following portions of the patient's history were reviewed and updated as appropriate: allergies, current medications, past family history, past medical history, past social history, past surgical history and problem list.  Review of Systems Review of Systems -comprehensive review of systems is negative except for that noted in the HPI  Objective:   BP 124/65   Pulse 83   Ht 5' (1.524 m)   Wt 130 lb 12.8 oz (59.3 kg)   BMI 25.55 kg/m  CONSTITUTIONAL: Well-developed, well-nourished female in no acute distress.  HENT:  Normocephalic, atraumatic.  NECK: Not examined SKIN: Skin is warm and dry. No rash noted. Not diaphoretic. No erythema. No pallor. NEUROLGIC: Alert and oriented to person, place, and time. PSYCHIATRIC: Normal mood and affect. Normal behavior. Normal judgment and thought content. CARDIOVASCULAR:Not Examined RESPIRATORY: Not Examined BREASTS: Not Examined ABDOMEN: Soft, non distended; Non tender.  No Organomegaly. PELVIC:  External Genitalia: Normal  BUS: Urethral caruncle present; narrowed introitus admitting single digit on exam  Vagina: Moderate to severe vaginal atrophy; excellent support without evidence of prolapse  Cervix: No cervical motion tenderness; cervical os is obliterated  Uterus: Small size, normal shape, normal consistency, and mobile  Adnexa: Nonpalpable and nontender  RV: Normal external exam  Bladder: Nontender MUSCULOSKELETAL: Normal range of motion. No tenderness.  No cyanosis, clubbing, or edema.     Assessment:   1. Urethral caruncle  2. Vaginal atrophy, moderate to severe   I suspect the patient had an episode of hemorrhagic cystitis and urethritis which was treated with doxycycline.  Symptoms have pretty much resolved and her abnormal changes noted initially on exam prior to treatment with antibiotics have resolved, with only findings now suggestive of a  urethral caruncle. There is no evidence of pelvic organ prolapse.  Plan:   1.  Recommend Premarin cream intravaginally 1/2-1 g twice weekly; the medication may also be applied directly to the urethral meatus to try and minimize the extent of urethral caruncle that is noted 2.  Patient understands that the Premarin cream intravaginally may help with the moderate to severe vaginal atrophy that can further renew the epithelium by improving vascularization and possible collagen deposition.  This could certainly help with intercourse if desired. 3.  Patient is to return in 6 months for follow-up  A total of 30 minutes were spent face-to-face with the patient during the encounter with greater than 50% dealing with counseling and coordination of care.  Herold Harms, MD  Note: This dictation was prepared with Reubin Milan  dictation along with smaller phrase technology. Any transcriptional errors that result from this process are unintentional.

## 2017-03-18 ENCOUNTER — Ambulatory Visit (INDEPENDENT_AMBULATORY_CARE_PROVIDER_SITE_OTHER): Payer: Medicare Other | Admitting: Family Medicine

## 2017-03-18 VITALS — BP 122/56 | HR 78 | Temp 97.7°F | Resp 14 | Ht 61.0 in | Wt 132.0 lb

## 2017-03-18 DIAGNOSIS — Z0000 Encounter for general adult medical examination without abnormal findings: Secondary | ICD-10-CM | POA: Diagnosis not present

## 2017-03-18 DIAGNOSIS — Z1211 Encounter for screening for malignant neoplasm of colon: Secondary | ICD-10-CM

## 2017-03-18 DIAGNOSIS — E7800 Pure hypercholesterolemia, unspecified: Secondary | ICD-10-CM

## 2017-03-18 DIAGNOSIS — Z124 Encounter for screening for malignant neoplasm of cervix: Secondary | ICD-10-CM

## 2017-03-18 DIAGNOSIS — F5101 Primary insomnia: Secondary | ICD-10-CM | POA: Diagnosis not present

## 2017-03-18 LAB — CBC WITH DIFFERENTIAL/PLATELET
BASOS PCT: 1.3 %
Basophils Absolute: 69 cells/uL (ref 0–200)
EOS ABS: 90 {cells}/uL (ref 15–500)
Eosinophils Relative: 1.7 %
HCT: 32.8 % — ABNORMAL LOW (ref 35.0–45.0)
HEMOGLOBIN: 11.4 g/dL — AB (ref 11.7–15.5)
Lymphs Abs: 1691 cells/uL (ref 850–3900)
MCH: 32.3 pg (ref 27.0–33.0)
MCHC: 34.8 g/dL (ref 32.0–36.0)
MCV: 92.9 fL (ref 80.0–100.0)
MONOS PCT: 11.5 %
MPV: 10 fL (ref 7.5–12.5)
NEUTROS ABS: 2841 {cells}/uL (ref 1500–7800)
Neutrophils Relative %: 53.6 %
Platelets: 241 10*3/uL (ref 140–400)
RBC: 3.53 10*6/uL — ABNORMAL LOW (ref 3.80–5.10)
RDW: 11.5 % (ref 11.0–15.0)
Total Lymphocyte: 31.9 %
WBC: 5.3 10*3/uL (ref 3.8–10.8)
WBCMIX: 610 {cells}/uL (ref 200–950)

## 2017-03-18 LAB — LIPID PANEL
CHOL/HDL RATIO: 3.1 (calc) (ref <5.0)
CHOLESTEROL: 177 mg/dL (ref <200)
HDL: 57 mg/dL (ref >50)
LDL CHOLESTEROL (CALC): 105 mg/dL — AB
Non-HDL Cholesterol (Calc): 120 mg/dL (calc) (ref <130)
TRIGLYCERIDES: 63 mg/dL (ref <150)

## 2017-03-18 LAB — COMPLETE METABOLIC PANEL WITH GFR
AG RATIO: 1.7 (calc) (ref 1.0–2.5)
ALT: 14 U/L (ref 6–29)
AST: 19 U/L (ref 10–35)
Albumin: 4.3 g/dL (ref 3.6–5.1)
Alkaline phosphatase (APISO): 77 U/L (ref 33–130)
BILIRUBIN TOTAL: 0.5 mg/dL (ref 0.2–1.2)
BUN: 12 mg/dL (ref 7–25)
CHLORIDE: 103 mmol/L (ref 98–110)
CO2: 30 mmol/L (ref 20–32)
Calcium: 9.5 mg/dL (ref 8.6–10.4)
Creat: 0.75 mg/dL (ref 0.50–0.99)
GFR, EST AFRICAN AMERICAN: 96 mL/min/{1.73_m2} (ref >=60)
GFR, EST NON AFRICAN AMERICAN: 82 mL/min/{1.73_m2} (ref >=60)
GLOBULIN: 2.5 g/dL (ref 1.9–3.7)
Glucose, Bld: 90 mg/dL (ref 65–99)
POTASSIUM: 4.3 mmol/L (ref 3.5–5.3)
SODIUM: 139 mmol/L (ref 135–146)
TOTAL PROTEIN: 6.8 g/dL (ref 6.1–8.1)

## 2017-03-18 LAB — TSH: TSH: 1.74 mIU/L (ref 0.40–4.50)

## 2017-03-18 LAB — IFOBT (OCCULT BLOOD): IMMUNOLOGICAL FECAL OCCULT BLOOD TEST: NEGATIVE

## 2017-03-18 NOTE — Progress Notes (Signed)
Patient: Melanie Palmer, Female    DOB: 07-06-1950, 66 y.o.   MRN: [id number] Visit Date: 03/18/2017  Today's Provider: Megan Mans, MD   Chief Complaint  Patient presents with  . Annual Exam   Subjective:   Melanie Palmer is a 66 y.o. female who presents today for her Subsequent Annual Wellness Visit. She feels well. She reports exercising 3 times per week. She reports she is sleeping fairly well.  Immunization History  Administered Date(s) Administered  . Influenza Whole 01/28/2016  . Influenza, High Dose Seasonal PF 02/09/2015, 02/16/2017  . Pneumococcal Conjugate-13 07/06/2014, 02/09/2015  . Pneumococcal Polysaccharide-23 03/14/2016  . Tdap 10/28/2011  . Zoster 10/28/2011   09/16/12 Colonoscopy, Dr Heloise Purpura 5 years 05/10/15 Pap-neg 09/22/16 Mamm-neg 06/01/14 Dexa-Osteopenia  Review of Systems  Constitutional: Negative.   HENT: Positive for congestion, rhinorrhea and sinus pressure.   Eyes: Positive for discharge and itching.  Respiratory: Negative.   Cardiovascular: Negative.   Gastrointestinal: Negative.   Endocrine: Negative.   Genitourinary: Negative.   Musculoskeletal: Negative.   Skin: Negative.   Allergic/Immunologic: Positive for environmental allergies.  Neurological: Positive for dizziness.  Hematological: Negative.   Psychiatric/Behavioral: Negative.     Patient Active Problem List   Diagnosis Date Noted  . Urethral caruncle 03/10/2017  . Vaginal atrophy 03/10/2017  . Allergic rhinitis 05/03/2015  . Insomnia, persistent 05/03/2015  . Anxiety, generalized 05/03/2015  . Acid reflux 05/03/2015  . HLD (hyperlipidemia) 05/03/2015  . Adult hypothyroidism 05/03/2015  . Mild episode of recurrent major depressive disorder (HCC) 05/03/2015  . Panic attack 05/03/2015  . Apnea, sleep 05/03/2015    Social History   Social History  . Marital status: Married    Spouse name: N/A  . Number of children: N/A  . Years of education: N/A   Occupational  History  . Not on file.   Social History Main Topics  . Smoking status: Never Smoker  . Smokeless tobacco: Never Used  . Alcohol use No  . Drug use: No  . Sexual activity: Not Currently    Birth control/ protection: Post-menopausal   Other Topics Concern  . Not on file   Social History Narrative  . No narrative on file    Past Surgical History:  Procedure Laterality Date  . BREAST CYST EXCISION Left 1970's  . BREAST LUMPECTOMY Left   . CESAREAN SECTION    . HEEL SPUR SURGERY    . UPPER GI ENDOSCOPY  09/16/12   normal, no H Pylori, no need to repeat of EGD.    Her family history includes Breast cancer in her other; Congestive Heart Failure in her father; Heart disease in her father, sister, and son; Hypertension in her mother; Melanoma in her mother.     Outpatient Encounter Prescriptions as of 03/18/2017  Medication Sig Note  . clonazePAM (KLONOPIN) 0.5 MG tablet Take 0.5 mg by mouth 2 (two) times daily. Reported on 05/10/2015   . conjugated estrogens (PREMARIN) vaginal cream Place 1 Applicatorful vaginally daily.   . hydrOXYzine (VISTARIL) 25 MG capsule Take 25 mg by mouth daily.   Marland Kitchen loratadine (CLARITIN) 10 MG tablet Take 10 mg by mouth daily as needed for allergies.   Marland Kitchen omeprazole (PRILOSEC) 20 MG capsule take 1 capsule by mouth once daily   . rosuvastatin (CRESTOR) 5 MG tablet take 1 tablet by mouth once daily   . sertraline (ZOLOFT) 100 MG tablet Take by mouth. 05/03/2015: Per Dr. Maryruth Bun Received from: [facility]  . traZODone (DESYREL) 50  MG tablet Take 50 mg by mouth at bedtime.   Marland Kitchen venlafaxine XR (EFFEXOR-XR) 150 MG 24 hr capsule Take 150 mg by mouth daily. 11/19/2015: Received from: External Pharmacy   No facility-administered encounter medications on file as of 03/18/2017.     Allergies  Allergen Reactions  . Sulfa Antibiotics Hives  . Penicillins Rash    Patient Care Team: Maple Hudson., MD as PCP - General (Family  Medicine) Dingeldein, Viviann Spare, MD as Consulting Physician (Ophthalmology) Darliss Ridgel, MD as Referring Physician (Psychiatry)   Objective:   Vitals:  Vitals:   03/18/17 0920  BP: (!) 122/56  Pulse: 78  Resp: 14  Temp: 97.7 F (36.5 C)  TempSrc: Oral  Weight: 132 lb (59.9 kg)  Height: 5\' 1"  (1.549 m)    Physical Exam  Constitutional: She is oriented to person, place, and time. She appears well-developed and well-nourished.  HENT:  Head: Normocephalic and atraumatic.  Right Ear: External ear normal.  Left Ear: External ear normal.  Nose: Nose normal.  Mouth/Throat: Oropharynx is clear and moist.  Eyes: Pupils are equal, round, and reactive to light. Conjunctivae and EOM are normal.  Neck: Normal range of motion. Neck supple.  Cardiovascular: Normal rate, regular rhythm, normal heart sounds and intact distal pulses.   Pulmonary/Chest: Effort normal and breath sounds normal.  Abdominal: Soft. Bowel sounds are normal.  Genitourinary: Vagina normal and uterus normal.  Musculoskeletal: Normal range of motion.  Neurological: She is alert and oriented to person, place, and time.  Skin: Skin is warm and dry.  Psychiatric: She has a normal mood and affect. Her behavior is normal. Judgment and thought content normal.    Activities of Daily Living In your present state of health, do you have any difficulty performing the following activities: 11/14/2016  Hearing? N  Vision? N  Difficulty concentrating or making decisions? N  Walking or climbing stairs? N  Dressing or bathing? N  Doing errands, shopping? N  Preparing Food and eating ? N  Using the Toilet? N  In the past six months, have you accidently leaked urine? N  Do you have problems with loss of bowel control? N  Managing your Medications? N  Managing your Finances? N  Housekeeping or managing your Housekeeping? N  Some recent data might be hidden    Fall Risk Assessment Fall Risk  11/14/2016 03/14/2016 05/10/2015   Falls in the past year? No No No     Depression Screen PHQ 2/9 Scores 02/16/2017 11/14/2016 11/14/2016 03/14/2016  PHQ - 2 Score 0 0 0 0  PHQ- 9 Score 1 0 - -    6CIT Screen 11/14/2016 03/14/2016  What Year? 0 points 0 points  What month? 0 points 0 points  What time? 0 points 0 points  Count back from 20 0 points 0 points  Months in reverse 0 points 0 points  Repeat phrase 4 points 2 points  Total Score 4 2      Assessment & Plan:     Annual Wellness Visit  Reviewed patient's Family Medical History Reviewed and updated list of patient's medical providers Assessment of cognitive impairment was done Assessed patient's functional ability Established a written schedule for health screening services Health Risk Assessent Completed and Reviewed  Exercise Activities and Dietary recommendations Goals    . Increase water intake          Starting 03/14/16, I will increase my water intake to 8 glasses a day.    Marland Kitchen  Increase water intake          Continue to increase water intake to 8 glasses a day.      1. Medicare annual wellness visit, subsequent - CBC with Differential/Platelet - Lipid Profile - TSH - Comprehensive metabolic panel  2. Colon cancer screening - IFOBT POC (occult bld, rslt in office); Future  3. Cervical cancer screening - Pap IG (Image Guided)  4. Pure hypercholesterolemia - CBC with Differential/Platelet - Lipid Profile  5. Primary insomnia - TSH - Comprehensive metabolic panel  I have done the exam and reviewed the chart and it is accurate to the best of my knowledge. DentistDragon  technology has been used and  any errors in dictation or transcription are unintentional. Julieanne Mansonichard  M.D. [facility]

## 2017-03-20 ENCOUNTER — Telehealth: Payer: Self-pay | Admitting: Family Medicine

## 2017-03-20 LAB — PAP IG (IMAGE GUIDED)

## 2017-03-20 NOTE — Telephone Encounter (Signed)
Pt is returning call.  CB#[phone]/MW

## 2017-05-04 DIAGNOSIS — F331 Major depressive disorder, recurrent, moderate: Secondary | ICD-10-CM | POA: Diagnosis not present

## 2017-05-04 DIAGNOSIS — F411 Generalized anxiety disorder: Secondary | ICD-10-CM | POA: Diagnosis not present

## 2017-05-04 DIAGNOSIS — F410 Panic disorder [episodic paroxysmal anxiety] without agoraphobia: Secondary | ICD-10-CM | POA: Diagnosis not present

## 2017-05-27 ENCOUNTER — Ambulatory Visit: Payer: Self-pay | Admitting: Family Medicine

## 2017-06-11 ENCOUNTER — Encounter: Payer: Self-pay | Admitting: Family Medicine

## 2017-06-11 ENCOUNTER — Ambulatory Visit: Payer: Medicare Other | Admitting: Family Medicine

## 2017-06-11 VITALS — BP 142/78 | HR 74 | Temp 97.8°F | Resp 16 | Wt 132.0 lb

## 2017-06-11 DIAGNOSIS — J0140 Acute pansinusitis, unspecified: Secondary | ICD-10-CM | POA: Diagnosis not present

## 2017-06-11 MED ORDER — DOXYCYCLINE HYCLATE 100 MG PO TABS: 100.0000 mg | ORAL_TABLET | Freq: Two times a day (BID) | ORAL | 1 refills | Status: DC

## 2017-06-11 NOTE — Progress Notes (Signed)
Patient: Melanie Palmer Female    DOB: [birth year]   67 y.o.   MRN: [id number] Visit Date: 06/11/2017  Today's Provider: Megan Mansichard  Jr, MD   Chief Complaint  Patient presents with  . URI   Subjective:    HPI Pt is here for sinus congestion and possible sinus infection. She reports that it started 2 weeks ago. Started with a sore throat. She has ear pain, sinus congestion, runny nose, productive cough (at night), yellow mucus, she had chills when this started. She reports that her sister is dying and she needs to get better and feel better.       Allergies  Allergen Reactions  . Sulfa Antibiotics Hives  . Penicillins Rash     Current Outpatient Medications:  .  clonazePAM (KLONOPIN) 0.5 MG tablet, Take 0.5 mg by mouth 2 (two) times daily. Reported on 05/10/2015, Disp: , Rfl:  .  conjugated estrogens (PREMARIN) vaginal cream, Place 1 Applicatorful vaginally daily., Disp: 42.5 g, Rfl: 12 .  hydrOXYzine (VISTARIL) 25 MG capsule, Take 25 mg by mouth daily., Disp: , Rfl:  .  loratadine (CLARITIN) 10 MG tablet, Take 10 mg by mouth daily as needed for allergies., Disp: , Rfl:  .  omeprazole (PRILOSEC) 20 MG capsule, take 1 capsule by mouth once daily, Disp: 90 capsule, Rfl: 3 .  rosuvastatin (CRESTOR) 5 MG tablet, take 1 tablet by mouth once daily, Disp: 90 tablet, Rfl: 3 .  sertraline (ZOLOFT) 100 MG tablet, Take by mouth., Disp: , Rfl:  .  traZODone (DESYREL) 50 MG tablet, Take 50 mg by mouth at bedtime., Disp: , Rfl:  .  venlafaxine XR (EFFEXOR-XR) 150 MG 24 hr capsule, Take 150 mg by mouth daily., Disp: , Rfl: 0  Review of Systems  Constitutional: Positive for chills and fatigue.  HENT: Positive for congestion, ear pain, postnasal drip, rhinorrhea, sinus pressure, sinus pain and sore throat.   Eyes: Negative.   Respiratory: Positive for cough.   Cardiovascular: Negative.   Gastrointestinal: Negative.   Endocrine: Negative.   Genitourinary: Negative.     Musculoskeletal: Negative.   Skin: Negative.   Allergic/Immunologic: Negative.   Neurological: Negative.   Hematological: Negative.   Psychiatric/Behavioral: Negative.     Social History   Tobacco Use  . Smoking status: Never Smoker  . Smokeless tobacco: Never Used  Substance Use Topics  . Alcohol use: No   Objective:   BP (!) 142/78 (BP Location: Left Arm, Patient Position: Sitting, Cuff Size: Normal)   Pulse 74   Temp 97.8 F (36.6 C) (Oral)   Resp 16   Wt 132 lb (59.9 kg)   SpO2 98%   BMI 24.94 kg/m  Vitals:   06/11/17 1340  BP: (!) 142/78  Pulse: 74  Resp: 16  Temp: 97.8 F (36.6 C)  TempSrc: Oral  SpO2: 98%  Weight: 132 lb (59.9 kg)     Physical Exam  Constitutional: She is oriented to person, place, and time. She appears well-developed and well-nourished.  HENT:  Head: Normocephalic and atraumatic.  Right Ear: External ear normal.  Left Ear: External ear normal.  Nose: Nose normal.  Eyes: Conjunctivae are normal. No scleral icterus.  Neck: No thyromegaly present.  Cardiovascular: Normal rate, regular rhythm and normal heart sounds.  Pulmonary/Chest: Effort normal and breath sounds normal.  Abdominal: Soft.  Neurological: She is alert and oriented to person, place, and time.  Skin: Skin is warm and dry.  Psychiatric: She has  a normal mood and affect. Her behavior is normal. Judgment and thought content normal.        Assessment & Plan:     1. Acute pansinusitis, recurrence not specified  - doxycycline (VIBRA-TABS) 100 MG tablet; Take 1 tablet (100 mg total) by mouth 2 (two) times daily.  Dispense: 20 tablet; Refill: 1      I have done the exam and reviewed the chart and it is accurate to the best of my knowledge. Dentist has been used and  any errors in dictation or transcription are unintentional. Julieanne Manson M.D. [facility]  Megan Mans, MD  [facility]

## 2017-08-02 ENCOUNTER — Other Ambulatory Visit: Payer: Self-pay | Admitting: Family Medicine

## 2017-08-14 ENCOUNTER — Other Ambulatory Visit: Payer: Self-pay | Admitting: Family Medicine

## 2017-08-14 DIAGNOSIS — Z1231 Encounter for screening mammogram for malignant neoplasm of breast: Secondary | ICD-10-CM

## 2017-08-17 ENCOUNTER — Ambulatory Visit
Admission: RE | Admit: 2017-08-17 | Discharge: 2017-08-17 | Disposition: A | Discharge status: Home / Self Care | Payer: Medicare Other | Source: Ambulatory Visit | Attending: Family Medicine | Admitting: Family Medicine

## 2017-08-17 ENCOUNTER — Ambulatory Visit: Payer: Medicare Other | Admitting: Family Medicine

## 2017-08-17 ENCOUNTER — Encounter: Payer: Self-pay | Admitting: Family Medicine

## 2017-08-17 VITALS — BP 120/60 | HR 82 | Temp 97.7°F | Resp 16 | Wt 129.0 lb

## 2017-08-17 DIAGNOSIS — M25552 Pain in left hip: Secondary | ICD-10-CM

## 2017-08-17 DIAGNOSIS — M160 Bilateral primary osteoarthritis of hip: Secondary | ICD-10-CM | POA: Insufficient documentation

## 2017-08-17 MED ORDER — MELOXICAM 7.5 MG PO TABS: 7.5000 mg | ORAL_TABLET | Freq: Every day | ORAL | 0 refills | Status: DC

## 2017-08-17 NOTE — Progress Notes (Signed)
Patient: Melanie BeamsSharon Kope Female    DOB: May 07, 1951   67 y.o.   MRN: [id number] Visit Date: 08/17/2017  Today's Provider: Megan Mansichard  Jr, MD   Chief Complaint  Patient presents with  . Hip Pain   Subjective:    HPI Pt reports that she started having left hip pain about 2 weeks ago. She works out at [employer] and does [employer] machines and she thinks that is where she injured her hip. Going up and down stairs, yard work, bending makes the pain worse. She has tried Excedrin for the pain, it has helped.       Allergies  Allergen Reactions  . Sulfa Antibiotics Hives  . Penicillins Rash     Current Outpatient Medications:  .  clonazePAM (KLONOPIN) 0.5 MG tablet, Take 0.5 mg by mouth 2 (two) times daily. Reported on 05/10/2015, Disp: , Rfl:  .  conjugated estrogens (PREMARIN) vaginal cream, Place 1 Applicatorful vaginally daily., Disp: 42.5 g, Rfl: 12 .  hydrOXYzine (VISTARIL) 25 MG capsule, Take 25 mg by mouth daily., Disp: , Rfl:  .  loratadine (CLARITIN) 10 MG tablet, Take 10 mg by mouth daily as needed for allergies., Disp: , Rfl:  .  rosuvastatin (CRESTOR) 5 MG tablet, TAKE 1 TABLET BY MOUTH ONCE DAILY, Disp: 90 tablet, Rfl: 0 .  sertraline (ZOLOFT) 100 MG tablet, Take by mouth., Disp: , Rfl:  .  traZODone (DESYREL) 50 MG tablet, Take 50 mg by mouth at bedtime., Disp: , Rfl:  .  venlafaxine XR (EFFEXOR-XR) 150 MG 24 hr capsule, Take 150 mg by mouth daily., Disp: , Rfl: 0 .  doxycycline (VIBRA-TABS) 100 MG tablet, Take 1 tablet (100 mg total) by mouth 2 (two) times daily. (Patient not taking: Reported on 08/17/2017), Disp: 20 tablet, Rfl: 1 .  omeprazole (PRILOSEC) 20 MG capsule, take 1 capsule by mouth once daily, Disp: 90 capsule, Rfl: 3  Review of Systems  Constitutional: Negative.   HENT: Negative.   Eyes: Negative.   Respiratory: Negative.   Cardiovascular: Negative.   Gastrointestinal: Negative.   Endocrine: Negative.   Genitourinary: Negative.     Musculoskeletal: Positive for arthralgias.  Skin: Negative.   Allergic/Immunologic: Negative.   Neurological: Negative.   Hematological: Negative.   Psychiatric/Behavioral: Negative.     Social History   Tobacco Use  . Smoking status: Never Smoker  . Smokeless tobacco: Never Used  Substance Use Topics  . Alcohol use: No   Objective:   BP 120/60 (BP Location: Left Arm, Patient Position: Sitting, Cuff Size: Normal)   Pulse 82   Temp 97.7 F (36.5 C) (Oral)   Resp 16   Wt 129 lb (58.5 kg)   BMI 24.37 kg/m  Vitals:   08/17/17 0826  BP: 120/60  Pulse: 82  Resp: 16  Temp: 97.7 F (36.5 C)  TempSrc: Oral  Weight: 129 lb (58.5 kg)     Physical Exam  Constitutional: She is oriented to person, place, and time. She appears well-developed and well-nourished.  HENT:  Head: Normocephalic and atraumatic.  Eyes: Conjunctivae are normal. No scleral icterus.  Cardiovascular: Normal rate and regular rhythm.  Pulmonary/Chest: Effort normal.  Musculoskeletal:  Straight leg negative. Mildly tender over left greater trochanter.Figure 4 positive on left.  Neurological: She is alert and oriented to person, place, and time.  Skin: Skin is warm and dry.  Psychiatric: She has a normal mood and affect. Her behavior is normal. Judgment and thought content normal.  Assessment & Plan:     1. Pain of left hip joint Refer to Dr Kirtland Bouchard if not improving in next 2-3 weeks. - DG HIP UNILAT WITH PELVIS 1V LEFT; Future - meloxicam (MOBIC) 7.5 MG tablet; Take 1 tablet (7.5 mg total) by mouth daily.  Dispense: 30 tablet; Refill: 0     I have done the exam and reviewed the chart and it is accurate to the best of my knowledge. Dentist has been used and  any errors in dictation or transcription are unintentional. Julieanne Manson M.D. [facility]   Megan Mans, MD  [facility]

## 2017-08-17 NOTE — Addendum Note (Signed)
Addended by: Latanya Presser'DELL, Mackensie Pilson M on: 08/17/2017 09:46 AM   Modules accepted: Orders

## 2017-08-21 DIAGNOSIS — F411 Generalized anxiety disorder: Secondary | ICD-10-CM | POA: Diagnosis not present

## 2017-08-21 DIAGNOSIS — F331 Major depressive disorder, recurrent, moderate: Secondary | ICD-10-CM | POA: Diagnosis not present

## 2017-08-21 DIAGNOSIS — F410 Panic disorder [episodic paroxysmal anxiety] without agoraphobia: Secondary | ICD-10-CM | POA: Diagnosis not present

## 2017-09-10 ENCOUNTER — Encounter: Payer: Medicare Other | Admitting: Obstetrics and Gynecology

## 2017-09-14 ENCOUNTER — Encounter: Payer: Self-pay | Admitting: Family Medicine

## 2017-09-14 ENCOUNTER — Ambulatory Visit: Payer: Medicare Other | Admitting: Family Medicine

## 2017-09-14 VITALS — BP 118/74 | Temp 98.4°F | Resp 16 | Ht 61.0 in | Wt 127.0 lb

## 2017-09-14 DIAGNOSIS — E7800 Pure hypercholesterolemia, unspecified: Secondary | ICD-10-CM | POA: Diagnosis not present

## 2017-09-14 DIAGNOSIS — G4700 Insomnia, unspecified: Secondary | ICD-10-CM

## 2017-09-14 NOTE — Progress Notes (Signed)
Patient: Melanie Palmer Female    DOB: 09-Oct-1950   67 y.o.   MRN: [id number] Visit Date: 09/14/2017  Today's Provider: Megan Mans, MD   Chief Complaint  Patient presents with  . Hyperlipidemia  . Hypertension   Subjective:    HPI  Patient comes in today for a 6 month follow up.   Hyperlipidemia- on 03/18/2017, no medications were changed. Patient reports good compliance and good symptom control. Lab Results  Component Value Date   CHOL 177 03/18/2017   HDL 57 03/18/2017   LDLCALC 105 (H) 03/18/2017   TRIG 63 03/18/2017   CHOLHDL 3.1 03/18/2017   Insomnia- on 03/18/2017, no medications were changed. Patient is currently taking Trazodone . She reports that it varies on how much sleep she gets at night.       Allergies  Allergen Reactions  . Sulfa Antibiotics Hives  . Penicillins Rash     Current Outpatient Medications:  .  clonazePAM (KLONOPIN) 0.5 MG tablet, Take 0.5 mg by mouth 2 (two) times daily. Reported on 05/10/2015, Disp: , Rfl:  .  conjugated estrogens (PREMARIN) vaginal cream, Place 1 Applicatorful vaginally daily., Disp: 42.5 g, Rfl: 12 .  hydrOXYzine (VISTARIL) 25 MG capsule, Take 25 mg by mouth daily., Disp: , Rfl:  .  loratadine (CLARITIN) 10 MG tablet, Take 10 mg by mouth daily as needed for allergies., Disp: , Rfl:  .  meloxicam (MOBIC) 7.5 MG tablet, Take 1 tablet (7.5 mg total) by mouth daily., Disp: 30 tablet, Rfl: 0 .  omeprazole (PRILOSEC) 20 MG capsule, take 1 capsule by mouth once daily, Disp: 90 capsule, Rfl: 3 .  rosuvastatin (CRESTOR) 5 MG tablet, TAKE 1 TABLET BY MOUTH ONCE DAILY, Disp: 90 tablet, Rfl: 0 .  sertraline (ZOLOFT) 100 MG tablet, Take by mouth., Disp: , Rfl:  .  traZODone (DESYREL) 50 MG tablet, Take 50 mg by mouth at bedtime., Disp: , Rfl:  .  venlafaxine XR (EFFEXOR-XR) 150 MG 24 hr capsule, Take 150 mg by mouth daily., Disp: , Rfl: 0 .  doxycycline (VIBRA-TABS) 100 MG tablet, Take 1 tablet (100 mg total) by  mouth 2 (two) times daily. (Patient not taking: Reported on 08/17/2017), Disp: 20 tablet, Rfl: 1  Review of Systems  Constitutional: Negative for activity change, appetite change, diaphoresis, fatigue, fever and unexpected weight change.  Eyes: Negative.   Respiratory: Negative.   Cardiovascular: Negative for chest pain and leg swelling.  Endocrine: Negative.   Musculoskeletal: Positive for arthralgias and myalgias. Negative for back pain.  Allergic/Immunologic: Negative.   Neurological: Negative for light-headedness and headaches.  Hematological: Negative.   Psychiatric/Behavioral: Negative for agitation, confusion, decreased concentration, hallucinations, self-injury, sleep disturbance and suicidal ideas. The patient is not nervous/anxious and is not hyperactive.     Social History   Tobacco Use  . Smoking status: Never Smoker  . Smokeless tobacco: Never Used  Substance Use Topics  . Alcohol use: No   Objective:   BP 118/74 (BP Location: Right Arm, Patient Position: Sitting, Cuff Size: Normal)   Temp 98.4 F (36.9 C)   Resp 16   Ht  (1.549 m)   Wt 127 lb (57.6 kg)   BMI 24.00 kg/m  Vitals:   09/14/17 1045  BP: 118/74  Resp: 16  Temp: 98.4 F (36.9 C)  Weight: 127 lb (57.6 kg)  Height:  (1.549 m)     Physical Exam  Constitutional: She is oriented to person, place,  and time. She appears well-developed.  Neck: Normal range of motion. Neck supple.  Cardiovascular: Normal rate, regular rhythm and normal heart sounds.  Pulmonary/Chest: Effort normal and breath sounds normal.  Musculoskeletal: Normal range of motion.  Neurological: She is alert and oriented to person, place, and time.  Psychiatric: She has a normal mood and affect. Her behavior is normal. Judgment normal.        Assessment & Plan:     1. Pure hypercholesterolemia RTC 6months. 2. 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      I have done the exam and reviewed  the above chart and it is accurate to the best of my knowledge. Dentist has been used in this note in any air is in the dictation or transcription are unintentional.  Megan Mans, MD  [facility]

## 2017-09-23 ENCOUNTER — Ambulatory Visit
Admission: RE | Admit: 2017-09-23 | Discharge: 2017-09-23 | Disposition: A | Discharge status: Home / Self Care | Payer: Medicare Other | Source: Ambulatory Visit | Attending: Family Medicine | Admitting: Family Medicine

## 2017-09-23 DIAGNOSIS — Z1231 Encounter for screening mammogram for malignant neoplasm of breast: Secondary | ICD-10-CM | POA: Diagnosis not present

## 2017-09-30 DIAGNOSIS — F410 Panic disorder [episodic paroxysmal anxiety] without agoraphobia: Secondary | ICD-10-CM | POA: Diagnosis not present

## 2017-09-30 DIAGNOSIS — F331 Major depressive disorder, recurrent, moderate: Secondary | ICD-10-CM | POA: Diagnosis not present

## 2017-09-30 DIAGNOSIS — F411 Generalized anxiety disorder: Secondary | ICD-10-CM | POA: Diagnosis not present

## 2017-11-02 ENCOUNTER — Other Ambulatory Visit: Payer: Self-pay | Admitting: Family Medicine

## 2017-11-12 ENCOUNTER — Other Ambulatory Visit: Payer: Self-pay | Admitting: Family Medicine

## 2017-11-12 DIAGNOSIS — K219 Gastro-esophageal reflux disease without esophagitis: Secondary | ICD-10-CM

## 2017-12-28 DIAGNOSIS — F411 Generalized anxiety disorder: Secondary | ICD-10-CM | POA: Diagnosis not present

## 2017-12-28 DIAGNOSIS — F331 Major depressive disorder, recurrent, moderate: Secondary | ICD-10-CM | POA: Diagnosis not present

## 2017-12-28 DIAGNOSIS — F410 Panic disorder [episodic paroxysmal anxiety] without agoraphobia: Secondary | ICD-10-CM | POA: Diagnosis not present

## 2017-12-29 ENCOUNTER — Telehealth: Payer: Self-pay

## 2017-12-29 DIAGNOSIS — Z1211 Encounter for screening for malignant neoplasm of colon: Secondary | ICD-10-CM

## 2017-12-29 NOTE — Telephone Encounter (Signed)
Patient requesting referral to [facility]. Patient is due to colonoscopy and has scheduled appointment on 01/01/2018.

## 2017-12-29 NOTE — Telephone Encounter (Signed)
Referral placed.

## 2017-12-29 NOTE — Telephone Encounter (Signed)
Ok to refer.

## 2017-12-29 NOTE — Telephone Encounter (Signed)
ok 

## 2018-01-01 DIAGNOSIS — Z1211 Encounter for screening for malignant neoplasm of colon: Secondary | ICD-10-CM | POA: Diagnosis not present

## 2018-01-01 DIAGNOSIS — Z8371 Family history of colonic polyps: Secondary | ICD-10-CM | POA: Diagnosis not present

## 2018-02-12 DIAGNOSIS — Z1211 Encounter for screening for malignant neoplasm of colon: Secondary | ICD-10-CM | POA: Diagnosis not present

## 2018-02-12 DIAGNOSIS — K640 First degree hemorrhoids: Secondary | ICD-10-CM | POA: Diagnosis not present

## 2018-02-12 DIAGNOSIS — Z8371 Family history of colonic polyps: Secondary | ICD-10-CM | POA: Diagnosis not present

## 2018-02-12 NOTE — Telephone Encounter (Signed)
This encounter was created in error - please disregard.

## 2018-03-19 ENCOUNTER — Ambulatory Visit: Payer: Medicare Other

## 2018-03-19 VITALS — BP 132/52 | HR 74 | Temp 98.0°F | Ht 61.0 in | Wt 126.8 lb

## 2018-03-19 DIAGNOSIS — Z23 Encounter for immunization: Secondary | ICD-10-CM

## 2018-03-19 DIAGNOSIS — Z0000 Encounter for general adult medical examination without abnormal findings: Secondary | ICD-10-CM | POA: Diagnosis not present

## 2018-03-19 NOTE — Progress Notes (Signed)
Subjective:   Melanie Palmer is a 67 y.o. female who presents for Medicare Annual (Subsequent) preventive examination.  Review of Systems:  N/A  Cardiac Risk Factors include: advanced age (>52men, >35 women);dyslipidemia     Objective:     Vitals: BP (!) 132/52 (BP Location: Right Arm)   Pulse 74   Temp 98 F (36.7 C) (Oral)   Ht 5\' 1"  (1.549 m)   Wt 126 lb 12.8 oz (57.5 kg)   BMI 23.96 kg/m   Body mass index is 23.96 kg/m.  Advanced Directives 03/19/2018 03/18/2017 11/14/2016 04/28/2016 03/14/2016 08/14/2015 07/16/2015  Does Patient Have a Medical Advance Directive? Yes Yes Yes Yes Yes Yes Yes  Type of Estate agent of [place];Living will Living will;Healthcare Power of [employer] Power of [place];Living will - - Healthcare Power of [place];Living will Healthcare Power of [place];Living will  Copy of Healthcare Power of Attorney in Chart? No - copy requested - No - copy requested - No - copy requested - -    Tobacco Social History   Tobacco Use  Smoking Status Never Smoker  Smokeless Tobacco Never Used     Counseling given: Not Answered   Clinical Intake:  Pre-visit preparation completed: Yes  Pain : No/denies pain Pain Score: 0-No pain     Nutritional Status: BMI of 19-24  Normal Nutritional Risks: None Diabetes: No  How often do you need to have someone help you when you read instructions, pamphlets, or other written materials from your doctor or pharmacy?: 1 - Never  Interpreter Needed?: No  Information entered by :: [facility], LPN  Past Medical History:  Diagnosis Date  . Anxiety   . Depression   . GERD (gastroesophageal reflux disease)   . Hypercholesteremia    Past Surgical History:  Procedure Laterality Date  . BREAST CYST EXCISION Left 1970's  . BREAST LUMPECTOMY Left   . CESAREAN SECTION    . COLONOSCOPY    . HEEL SPUR SURGERY    . UPPER GI ENDOSCOPY  09/16/12   normal, no H Pylori, no need to repeat  of EGD.   Family History  Problem Relation Age of Onset  . Hypertension Mother   . Melanoma Mother   . Heart disease Father   . Congestive Heart Failure Father   . Heart disease Sister   . Heart disease Son   . Breast cancer Other    Social History   Socioeconomic History  . Marital status: Married    Spouse name: Not on file  . Number of children: 2  . Years of education: Not on file  . Highest education level: High school graduate  Occupational History  . Not on file  Social Needs  . Financial resource strain: Not hard at all  . Food insecurity:    Worry: Never true    Inability: Never true  . Transportation needs:    Medical: No    Non-medical: No  Tobacco Use  . Smoking status: Never Smoker  . Smokeless tobacco: Never Used  Substance and Sexual Activity  . Alcohol use: No  . Drug use: No  . Sexual activity: Not Currently    Birth control/protection: Post-menopausal  Lifestyle  . Physical activity:    Days per week: 3 days    Minutes per session: 150+ min  . Stress: Not at all  Relationships  . Social connections:    Talks on phone: Patient refused    Gets together: Patient refused  Attends religious service: Patient refused    Active member of club or organization: Patient refused    Attends meetings of clubs or organizations: Patient refused    Relationship status: Patient refused  Other Topics Concern  . Not on file  Social History Narrative  . Not on file    Outpatient Encounter Medications as of 03/19/2018  Medication Sig  . clonazePAM (KLONOPIN) 0.5 MG tablet Take 0.5 mg by mouth 2 (two) times daily. Reported on 05/10/2015  . conjugated estrogens (PREMARIN) vaginal cream Place 1 Applicatorful vaginally daily.  . ferrous sulfate 325 (65 FE) MG EC tablet Take 325 mg by mouth daily with breakfast.   . hydrOXYzine (VISTARIL) 25 MG capsule Take 25 mg by mouth daily.  Marland Kitchen loratadine (CLARITIN) 10 MG tablet Take 10 mg by mouth daily as needed for  allergies.  . meloxicam (MOBIC) 7.5 MG tablet Take 1 tablet (7.5 mg total) by mouth daily.  Marland Kitchen omeprazole (PRILOSEC) 20 MG capsule TAKE ONE CAPSULE BY MOUTH ONCE DAILY  . rosuvastatin (CRESTOR) 5 MG tablet TAKE 1 TABLET BY MOUTH ONCE DAILY  . sertraline (ZOLOFT) 100 MG tablet Take 100 mg by mouth daily.   . traZODone (DESYREL) 50 MG tablet Take 50 mg by mouth at bedtime.  Marland Kitchen venlafaxine XR (EFFEXOR-XR) 150 MG 24 hr capsule Take 150 mg by mouth daily.  Marland Kitchen doxycycline (VIBRA-TABS) 100 MG tablet Take 1 tablet (100 mg total) by mouth 2 (two) times daily. (Patient not taking: Reported on 08/17/2017)   No facility-administered encounter medications on file as of 03/19/2018.     Activities of Daily Living In your present state of health, do you have any difficulty performing the following activities: 03/19/2018  Hearing? N  Vision? N  Difficulty concentrating or making decisions? N  Walking or climbing stairs? N  Dressing or bathing? N  Doing errands, shopping? N  Preparing Food and eating ? N  Using the Toilet? N  In the past six months, have you accidently leaked urine? N  Do you have problems with loss of bowel control? N  Managing your Medications? N  Managing your Finances? N  Housekeeping or managing your Housekeeping? N  Some recent data might be hidden    Patient Care Team: Maple Hudson., MD as PCP - General (Family Medicine) Dingeldein, Viviann Spare, MD as Consulting Physician (Ophthalmology) Darliss Ridgel, MD as Referring Physician (Psychiatry)    Assessment:   This is a routine wellness examination for Joei.  Exercise Activities and Dietary recommendations Current Exercise Habits: Structured exercise class, Type of exercise: strength training/weights;stretching;treadmill;walking, Time (Minutes): > 60, Frequency (Times/Week): 3, Weekly Exercise (Minutes/Week): 0, Intensity: Moderate, Exercise limited by: None identified  Goals    . Increase water intake     Starting  03/14/16, I will increase my water intake to 8 glasses a day.    . Increase water intake     Continue to increase water intake to 8 glasses a day.       Fall Risk Fall Risk  03/19/2018 11/14/2016 03/14/2016 05/10/2015  Falls in the past year? 0 No No No  Number falls in past yr: 0 - - -  Injury with Fall? 0 - - -   FALL RISK PREVENTION PERTAINING TO THE HOME:  Any stairs in or around the home WITH handrails? Yes  Home free of loose throw rugs in walkways, pet beds, electrical cords, etc? Yes  Adequate lighting in your home to reduce risk of falls? Yes  ASSISTIVE DEVICES UTILIZED TO PREVENT FALLS:  Life alert? No  Use of a cane, walker or w/c? No  Grab bars in the bathroom? Yes  Shower chair or bench in shower? No  Elevated toilet seat or a handicapped toilet? No    TIMED UP AND GO:  Was the test performed? No .     Depression Screen PHQ 2/9 Scores 03/19/2018 03/19/2018 02/16/2017 11/14/2016  PHQ - 2 Score 0 0 0 0  PHQ- 9 Score 0 - 1 0     Cognitive Function     6CIT Screen 11/14/2016 03/14/2016  What Year? 0 points 0 points  What month? 0 points 0 points  What time? 0 points 0 points  Count back from 20 0 points 0 points  Months in reverse 0 points 0 points  Repeat phrase 4 points 2 points  Total Score 4 2    Immunization History  Administered Date(s) Administered  . Influenza Whole 01/28/2016  . Influenza, High Dose Seasonal PF 02/09/2015, 02/16/2017, 03/19/2018  . Pneumococcal Conjugate-13 07/06/2014, 02/09/2015  . Pneumococcal Polysaccharide-23 03/14/2016  . Tdap 10/28/2011  . Zoster 10/28/2011    Qualifies for Shingles Vaccine? Yes  Zostavax completed 10/28/11. Due for Shingrix. Education has been provided regarding the importance of this vaccine. Pt has been advised to call insurance company to determine out of pocket expense. Advised may also receive vaccine at local pharmacy or Health Dept. Verbalized acceptance and understanding.  Tdap: Up to  date  Flu Vaccine: Due for Flu vaccine. Does the patient want to receive this vaccine today?  Yes .   Pneumococcal Vaccine: Up to date   Screening Tests Health Maintenance  Topic Date Due  . DEXA SCAN  06/02/2019  . MAMMOGRAM  09/24/2019  . TETANUS/TDAP  10/27/2021  . COLONOSCOPY  02/13/2023  . INFLUENZA VACCINE  Completed  . Hepatitis C Screening  Completed  . PNA vac Low Risk Adult  Completed   Cancer Screenings:  Colorectal Screening: Completed 02/12/18. Repeat every 5 years.   Mammogram: Completed 09/23/17.   Bone Density: Completed 06/01/14. Results reflect OSTEOPENIA. Repeat every 5 years.   Lung Cancer Screening: (Low Dose CT Chest recommended if Age 45-80 years, 30 pack-year currently smoking OR have quit w/in 15years.) does not qualify.    Additional Screening:  Hepatitis C Screening: Up to date  Vision Screening: Recommended annual ophthalmology exams for early detection of glaucoma and other disorders of the eye.  Dental Screening: Recommended annual dental exams for proper oral hygiene  Community Resource Referral:  CRR required this visit?  No       Plan:  I have personally reviewed and addressed the Medicare Annual Wellness questionnaire and have noted the following in the patient's chart:  A. Medical and social history B. Use of alcohol, tobacco or illicit drugs  C. Current medications and supplements D. Functional ability and status E.  Nutritional status F.  Physical activity G. Advance directives H. List of other physicians I.  Hospitalizations, surgeries, and ER visits in previous 12 months J.  Vitals K. Screenings such as hearing and vision if needed, cognitive and depression L. Referrals and appointments - none  In addition, I have reviewed and discussed with patient certain preventive protocols, quality metrics, and best practice recommendations. A written personalized care plan for preventive services as well as general preventive  health recommendations were provided to patient.  See attached scanned questionnaire for additional information.   Signed,  Hyacinth Meeker, LPN Nurse Health Advisor  Nurse Recommendations: None.

## 2018-03-19 NOTE — Patient Instructions (Addendum)
[name] , Thank you for taking time to come for your Medicare Wellness Visit. I appreciate your ongoing commitment to your health goals. Please review the following plan we discussed and let me know if I can assist you in the future.   Screening recommendations/referrals: Colonoscopy: Up to date Mammogram: Up to date Bone Density: Up to date Recommended yearly ophthalmology/optometry visit for glaucoma screening and checkup Recommended yearly dental visit for hygiene and checkup  Vaccinations: Influenza vaccine: Up to date Pneumococcal vaccine: Up to date Tdap vaccine: Up to date Shingles vaccine: Pt declines today.     Advanced directives: Please bring a copy of your POA (Power of Attorney) and/or Living Will to your next appointment.   Conditions/risks identified: None.   Next appointment: 03/24/18 @ 8:40 AM with Dr Sullivan Lone.    Preventive Care 45 Years and Older, Female Preventive care refers to lifestyle choices and visits with your health care provider that can promote health and wellness. What does preventive care include?  A yearly physical exam. This is also called an annual well check.  Dental exams once or twice a year.  Routine eye exams. Ask your health care provider how often you should have your eyes checked.  Personal lifestyle choices, including:  Daily care of your teeth and gums.  Regular physical activity.  Eating a healthy diet.  Avoiding tobacco and drug use.  Limiting alcohol use.  Practicing safe sex.  Taking low-dose aspirin every day.  Taking vitamin and mineral supplements as recommended by your health care provider. What happens during an annual well check? The services and screenings done by your health care provider during your annual well check will depend on your age, overall health, lifestyle risk factors, and family history of disease. Counseling  Your health care provider may ask you questions about your:  Alcohol  use.  Tobacco use.  Drug use.  Emotional well-being.  Home and relationship well-being.  Sexual activity.  Eating habits.  History of falls.  Memory and ability to understand (cognition).  Work and work Astronomer.  Reproductive health. Screening  You may have the following tests or measurements:  Height, weight, and BMI.  Blood pressure.  Lipid and cholesterol levels. These may be checked every 5 years, or more frequently if you are over 65 years old.  Skin check.  Lung cancer screening. You may have this screening every year starting at age 88 if you have a 30-pack-year history of smoking and currently smoke or have quit within the past 15 years.  Fecal occult blood test (FOBT) of the stool. You may have this test every year starting at age 3.  Flexible sigmoidoscopy or colonoscopy. You may have a sigmoidoscopy every 5 years or a colonoscopy every 10 years starting at age 35.  Hepatitis C blood test.  Hepatitis B blood test.  Sexually transmitted disease (STD) testing.  Diabetes screening. This is done by checking your blood sugar (glucose) after you have not eaten for a while (fasting). You may have this done every 1-3 years.  Bone density scan. This is done to screen for osteoporosis. You may have this done starting at age 33.  Mammogram. This may be done every 1-2 years. Talk to your health care provider about how often you should have regular mammograms. Talk with your health care provider about your test results, treatment options, and if necessary, the need for more tests. Vaccines  Your health care provider may recommend certain vaccines, such as:  Influenza vaccine.  This is recommended every year.  Tetanus, diphtheria, and acellular pertussis (Tdap, Td) vaccine. You may need a Td booster every 10 years.  Zoster vaccine. You may need this after age 67.  Pneumococcal 13-valent conjugate (PCV13) vaccine. One dose is recommended after age  71.  Pneumococcal polysaccharide (PPSV23) vaccine. One dose is recommended after age 86. Talk to your health care provider about which screenings and vaccines you need and how often you need them. This information is not intended to replace advice given to you by your health care provider. Make sure you discuss any questions you have with your health care provider. Document Released: 06/01/2015 Document Revised: 01/23/2016 Document Reviewed: 03/06/2015 Elsevier Interactive Patient Education  2017 [employer] Prevention in the Home Falls can cause injuries. They can happen to people of all ages. There are many things you can do to make your home safe and to help prevent falls. What can I do on the outside of my home?  Regularly fix the edges of walkways and driveways and fix any cracks.  Remove anything that might make you trip as you walk through a door, such as a raised step or threshold.  Trim any bushes or trees on the path to your home.  Use bright outdoor lighting.  Clear any walking paths of anything that might make someone trip, such as rocks or tools.  Regularly check to see if handrails are loose or broken. Make sure that both sides of any steps have handrails.  Any raised decks and porches should have guardrails on the edges.  Have any leaves, snow, or ice cleared regularly.  Use sand or salt on walking paths during winter.  Clean up any spills in your garage right away. This includes oil or grease spills. What can I do in the bathroom?  Use night lights.  Install grab bars by the toilet and in the tub and shower. Do not use towel bars as grab bars.  Use non-skid mats or decals in the tub or shower.  If you need to sit down in the shower, use a plastic, non-slip stool.  Keep the floor dry. Clean up any water that spills on the floor as soon as it happens.  Remove soap buildup in the tub or shower regularly.  Attach bath mats securely with double-sided  non-slip rug tape.  Do not have throw rugs and other things on the floor that can make you trip. What can I do in the bedroom?  Use night lights.  Make sure that you have a light by your bed that is easy to reach.  Do not use any sheets or blankets that are too big for your bed. They should not hang down onto the floor.  Have a firm chair that has side arms. You can use this for support while you get dressed.  Do not have throw rugs and other things on the floor that can make you trip. What can I do in the kitchen?  Clean up any spills right away.  Avoid walking on wet floors.  Keep items that you use a lot in easy-to-reach places.  If you need to reach something above you, use a strong step stool that has a grab bar.  Keep electrical cords out of the way.  Do not use floor polish or wax that makes floors slippery. If you must use wax, use non-skid floor wax.  Do not have throw rugs and other things on the floor that can make  you trip. What can I do with my stairs?  Do not leave any items on the stairs.  Make sure that there are handrails on both sides of the stairs and use them. Fix handrails that are broken or loose. Make sure that handrails are as long as the stairways.  Check any carpeting to make sure that it is firmly attached to the stairs. Fix any carpet that is loose or worn.  Avoid having throw rugs at the top or bottom of the stairs. If you do have throw rugs, attach them to the floor with carpet tape.  Make sure that you have a light switch at the top of the stairs and the bottom of the stairs. If you do not have them, ask someone to add them for you. What else can I do to help prevent falls?  Wear shoes that:  Do not have high heels.  Have rubber bottoms.  Are comfortable and fit you well.  Are closed at the toe. Do not wear sandals.  If you use a stepladder:  Make sure that it is fully opened. Do not climb a closed stepladder.  Make sure that both  sides of the stepladder are locked into place.  Ask someone to hold it for you, if possible.  Clearly mark and make sure that you can see:  Any grab bars or handrails.  First and last steps.  Where the edge of each step is.  Use tools that help you move around (mobility aids) if they are needed. These include:  Canes.  Walkers.  Scooters.  Crutches.  Turn on the lights when you go into a dark area. Replace any light bulbs as soon as they burn out.  Set up your furniture so you have a clear path. Avoid moving your furniture around.  If any of your floors are uneven, fix them.  If there are any pets around you, be aware of where they are.  Review your medicines with your doctor. Some medicines can make you feel dizzy. This can increase your chance of falling. Ask your doctor what other things that you can do to help prevent falls. This information is not intended to replace advice given to you by your health care provider. Make sure you discuss any questions you have with your health care provider. Document Released: 03/01/2009 Document Revised: 10/11/2015 Document Reviewed: 06/09/2014 Elsevier Interactive Patient Education  2017 [employer].

## 2018-03-24 ENCOUNTER — Ambulatory Visit (INDEPENDENT_AMBULATORY_CARE_PROVIDER_SITE_OTHER): Payer: Medicare Other | Admitting: Family Medicine

## 2018-03-24 ENCOUNTER — Encounter: Payer: Self-pay | Admitting: Family Medicine

## 2018-03-24 VITALS — BP 110/68 | HR 64 | Temp 98.4°F | Resp 16 | Ht 61.0 in | Wt 125.0 lb

## 2018-03-24 DIAGNOSIS — E7800 Pure hypercholesterolemia, unspecified: Secondary | ICD-10-CM

## 2018-03-24 DIAGNOSIS — Z0000 Encounter for general adult medical examination without abnormal findings: Secondary | ICD-10-CM

## 2018-03-24 DIAGNOSIS — G4700 Insomnia, unspecified: Secondary | ICD-10-CM | POA: Diagnosis not present

## 2018-03-24 DIAGNOSIS — E039 Hypothyroidism, unspecified: Secondary | ICD-10-CM

## 2018-03-24 NOTE — Progress Notes (Signed)
Patient: Melanie Palmer, Female    DOB: 12/20/1950, 67 y.o.   MRN: [id number] Visit Date: 03/24/2018  Today's Provider: Megan Mans, MD   Chief Complaint  Patient presents with  . Annual Wellness Exam   Subjective:  Patient saw Melanie Palmer for AWE on 03/19/2018.    Annual physical exam [name] is a 67 y.o. female who presents today for health maintenance and complete physical. She feels well. She reports exercising not regularly, but she does stay active. She reports she is sleeping well.   Colonoscopy- 09/16/2012. Dr. Bluford Kaufmann. Repeat in 5 years. Mammogram- 09/23/2017. Normal.  Pap- 03/18/2017. Normal. BMD- 05/22/2014. Osteopenia.     Review of Systems  Constitutional: Negative.   HENT: Negative.   Eyes: Negative.   Respiratory: Negative.   Cardiovascular: Negative.   Gastrointestinal: Negative.   Endocrine: Negative.   Genitourinary: Negative.   Musculoskeletal: Negative.   Skin: Negative.   Allergic/Immunologic: Negative.   Neurological: Negative.   Hematological: Negative.   Psychiatric/Behavioral: Negative.     Social History      She  reports that she has never smoked. She has never used smokeless tobacco. She reports that she does not drink alcohol or use drugs.       Social History   Socioeconomic History  . Marital status: Married    Spouse name: Not on file  . Number of children: 2  . Years of education: Not on file  . Highest education level: High school graduate  Occupational History  . Not on file  Social Needs  . Financial resource strain: Not hard at all  . Food insecurity:    Worry: Never true    Inability: Never true  . Transportation needs:    Medical: No    Non-medical: No  Tobacco Use  . Smoking status: Never Smoker  . Smokeless tobacco: Never Used  Substance and Sexual Activity  . Alcohol use: No  . Drug use: No  . Sexual activity: Not Currently    Birth control/protection: Post-menopausal  Lifestyle  . Physical  activity:    Days per week: 3 days    Minutes per session: 150+ min  . Stress: Not at all  Relationships  . Social connections:    Talks on phone: Patient refused    Gets together: Patient refused    Attends religious service: Patient refused    Active member of club or organization: Patient refused    Attends meetings of clubs or organizations: Patient refused    Relationship status: Patient refused  Other Topics Concern  . Not on file  Social History Narrative  . Not on file    Past Medical History:  Diagnosis Date  . Anxiety   . Depression   . GERD (gastroesophageal reflux disease)   . Hypercholesteremia      Patient Active Problem List   Diagnosis Date Noted  . Urethral caruncle 03/10/2017  . Vaginal atrophy 03/10/2017  . Allergic rhinitis 05/03/2015  . Insomnia, persistent 05/03/2015  . Anxiety, generalized 05/03/2015  . Acid reflux 05/03/2015  . HLD (hyperlipidemia) 05/03/2015  . Adult hypothyroidism 05/03/2015  . Mild episode of recurrent major depressive disorder (HCC) 05/03/2015  . Panic attack 05/03/2015  . Apnea, sleep 05/03/2015    Past Surgical History:  Procedure Laterality Date  . BREAST CYST EXCISION Left 1970's  . BREAST LUMPECTOMY Left   . CESAREAN SECTION    . COLONOSCOPY    . HEEL SPUR SURGERY    .  UPPER GI ENDOSCOPY  09/16/12   normal, no H Pylori, no need to repeat of EGD.    Family History        Family Status  Relation Name Status  . Mother  Deceased at age 32  . Father  Deceased at age 54  . Sister  Alive       alzheimer  . Son  Deceased  . Daughter  Alive  . Sister  Deceased       septic shock  . Sister  Alive  . Other Neice Alive        Her family history includes Breast cancer in her other; Congestive Heart Failure in her father; Heart disease in her father, sister, and son; Hypertension in her mother; Melanoma in her mother.      Allergies  Allergen Reactions  . Sulfa Antibiotics Hives  . Penicillins Rash      Current Outpatient Medications:  .  clonazePAM (KLONOPIN) 0.5 MG tablet, Take 0.5 mg by mouth 2 (two) times daily. Reported on 05/10/2015, Disp: , Rfl:  .  conjugated estrogens (PREMARIN) vaginal cream, Place 1 Applicatorful vaginally daily., Disp: 42.5 g, Rfl: 12 .  ferrous sulfate 325 (65 FE) MG EC tablet, Take 325 mg by mouth daily with breakfast. , Disp: , Rfl:  .  hydrOXYzine (VISTARIL) 25 MG capsule, Take 25 mg by mouth daily., Disp: , Rfl:  .  loratadine (CLARITIN) 10 MG tablet, Take 10 mg by mouth daily as needed for allergies., Disp: , Rfl:  .  meloxicam (MOBIC) 7.5 MG tablet, Take 1 tablet (7.5 mg total) by mouth daily., Disp: 30 tablet, Rfl: 0 .  omeprazole (PRILOSEC) 20 MG capsule, TAKE ONE CAPSULE BY MOUTH ONCE DAILY, Disp: 90 capsule, Rfl: 3 .  rosuvastatin (CRESTOR) 5 MG tablet, TAKE 1 TABLET BY MOUTH ONCE DAILY, Disp: 90 tablet, Rfl: 3 .  sertraline (ZOLOFT) 100 MG tablet, Take 100 mg by mouth daily. , Disp: , Rfl:  .  traZODone (DESYREL) 50 MG tablet, Take 50 mg by mouth at bedtime., Disp: , Rfl:  .  venlafaxine XR (EFFEXOR-XR) 150 MG 24 hr capsule, Take 150 mg by mouth daily., Disp: , Rfl: 0 .  doxycycline (VIBRA-TABS) 100 MG tablet, Take 1 tablet (100 mg total) by mouth 2 (two) times daily. (Patient not taking: Reported on 08/17/2017), Disp: 20 tablet, Rfl: 1   Patient Care Team: Maple Hudson., MD as PCP - General (Family Medicine) Dingeldein, Viviann Spare, MD as Consulting Physician (Ophthalmology) Darliss Ridgel, MD as Referring Physician (Psychiatry)      Objective:   Vitals: BP 110/68 (BP Location: Left Arm, Patient Position: Sitting, Cuff Size: Normal)   Pulse 64   Temp 98.4 F (36.9 C)   Resp 16   Ht 5\' 1"  (1.549 m)   Wt 125 lb (56.7 kg)   SpO2 99%   BMI 23.62 kg/m    Vitals:   03/24/18 0841  BP: 110/68  Pulse: 64  Resp: 16  Temp: 98.4 F (36.9 C)  SpO2: 99%  Weight: 125 lb (56.7 kg)  Height: 5\' 1"  (1.549 m)     Physical Exam   Constitutional: She is oriented to person, place, and time. She appears well-developed and well-nourished.  HENT:  Head: Normocephalic and atraumatic.  Right Ear: External ear normal.  Left Ear: External ear normal.  Nose: Nose normal.  Mouth/Throat: Oropharynx is clear and moist.  Eyes: Conjunctivae are normal. No scleral icterus.  Neck: No thyromegaly present.  Cardiovascular: Normal rate, regular rhythm and normal heart sounds.  Pulmonary/Chest: Effort normal and breath sounds normal.  Abdominal: Soft.  Musculoskeletal: She exhibits no edema.  Neurological: She is alert and oriented to person, place, and time.  Skin: Skin is warm and dry.  Psychiatric: She has a normal mood and affect. Her behavior is normal. Judgment and thought content normal.     Depression Screen PHQ 2/9 Scores 03/19/2018 03/19/2018 02/16/2017 11/14/2016  PHQ - 2 Score 0 0 0 0  PHQ- 9 Score 0 - 1 0      Assessment & Plan:     Routine Health Maintenance and Physical Exam  Exercise Activities and Dietary recommendations Goals    . Increase water intake     Starting 03/14/16, I will increase my water intake to 8 glasses a day.    . Increase water intake     Continue to increase water intake to 8 glasses a day.       Immunization History  Administered Date(s) Administered  . Influenza Whole 01/28/2016  . Influenza, High Dose Seasonal PF 02/09/2015, 02/16/2017, 03/19/2018  . Pneumococcal Conjugate-13 07/06/2014, 02/09/2015  . Pneumococcal Polysaccharide-23 03/14/2016  . Tdap 10/28/2011  . Zoster 10/28/2011    Health Maintenance  Topic Date Due  . DEXA SCAN  06/02/2019  . MAMMOGRAM  09/24/2019  . TETANUS/TDAP  10/27/2021  . COLONOSCOPY  02/13/2023  . INFLUENZA VACCINE  Completed  . Hepatitis C Screening  Completed  . PNA vac Low Risk Adult  Completed    Colonoscopy Dr Norma Fredrickson 02/05/18. Gyn care has been by Dr Greggory Keen. Discussed health benefits of physical activity, and encouraged her  to engage in regular exercise appropriate for her age and condition.    Wendelyn Breslow, MD  [facility]

## 2018-04-21 DIAGNOSIS — F410 Panic disorder [episodic paroxysmal anxiety] without agoraphobia: Secondary | ICD-10-CM | POA: Diagnosis not present

## 2018-04-21 DIAGNOSIS — F331 Major depressive disorder, recurrent, moderate: Secondary | ICD-10-CM | POA: Diagnosis not present

## 2018-04-21 DIAGNOSIS — F411 Generalized anxiety disorder: Secondary | ICD-10-CM | POA: Diagnosis not present

## 2018-06-21 DIAGNOSIS — F411 Generalized anxiety disorder: Secondary | ICD-10-CM | POA: Diagnosis not present

## 2018-06-21 DIAGNOSIS — F410 Panic disorder [episodic paroxysmal anxiety] without agoraphobia: Secondary | ICD-10-CM | POA: Diagnosis not present

## 2018-06-21 DIAGNOSIS — F331 Major depressive disorder, recurrent, moderate: Secondary | ICD-10-CM | POA: Diagnosis not present

## 2018-10-25 ENCOUNTER — Other Ambulatory Visit: Payer: Self-pay | Admitting: Family Medicine

## 2018-10-25 DIAGNOSIS — Z1231 Encounter for screening mammogram for malignant neoplasm of breast: Secondary | ICD-10-CM

## 2018-10-28 ENCOUNTER — Other Ambulatory Visit: Payer: Self-pay | Admitting: Family Medicine

## 2018-11-02 DIAGNOSIS — F331 Major depressive disorder, recurrent, moderate: Secondary | ICD-10-CM | POA: Diagnosis not present

## 2018-11-02 DIAGNOSIS — F410 Panic disorder [episodic paroxysmal anxiety] without agoraphobia: Secondary | ICD-10-CM | POA: Diagnosis not present

## 2018-11-02 DIAGNOSIS — F411 Generalized anxiety disorder: Secondary | ICD-10-CM | POA: Diagnosis not present

## 2018-11-11 ENCOUNTER — Other Ambulatory Visit: Payer: Self-pay | Admitting: Family Medicine

## 2018-11-11 DIAGNOSIS — K219 Gastro-esophageal reflux disease without esophagitis: Secondary | ICD-10-CM

## 2018-12-03 ENCOUNTER — Other Ambulatory Visit: Payer: Self-pay

## 2018-12-03 ENCOUNTER — Ambulatory Visit
Admission: RE | Admit: 2018-12-03 | Discharge: 2018-12-03 | Disposition: A | Discharge status: Home / Self Care | Payer: Medicare Other | Source: Ambulatory Visit | Attending: Family Medicine | Admitting: Family Medicine

## 2018-12-03 DIAGNOSIS — Z1231 Encounter for screening mammogram for malignant neoplasm of breast: Secondary | ICD-10-CM | POA: Insufficient documentation

## 2019-02-17 ENCOUNTER — Other Ambulatory Visit: Payer: Self-pay

## 2019-02-17 ENCOUNTER — Ambulatory Visit (INDEPENDENT_AMBULATORY_CARE_PROVIDER_SITE_OTHER): Payer: Medicare Other | Admitting: Family Medicine

## 2019-02-17 DIAGNOSIS — J0140 Acute pansinusitis, unspecified: Secondary | ICD-10-CM

## 2019-02-17 DIAGNOSIS — H6593 Unspecified nonsuppurative otitis media, bilateral: Secondary | ICD-10-CM

## 2019-02-17 DIAGNOSIS — Z20822 Contact with and (suspected) exposure to covid-19: Secondary | ICD-10-CM

## 2019-02-17 DIAGNOSIS — R05 Cough: Secondary | ICD-10-CM | POA: Diagnosis not present

## 2019-02-17 DIAGNOSIS — R059 Cough, unspecified: Secondary | ICD-10-CM

## 2019-02-17 MED ORDER — FLUTICASONE PROPIONATE 50 MCG/ACT NA SUSP: 2.0000 | Freq: Every day | NASAL | 6 refills | Status: DC

## 2019-02-17 MED ORDER — DOXYCYCLINE HYCLATE 100 MG PO TABS: 100.0000 mg | ORAL_TABLET | Freq: Two times a day (BID) | ORAL | 0 refills | Status: DC

## 2019-02-17 MED ORDER — DOXYCYCLINE HYCLATE 100 MG PO TABS: 100.0000 mg | ORAL_TABLET | Freq: Two times a day (BID) | ORAL | 1 refills | Status: DC

## 2019-02-17 NOTE — Progress Notes (Signed)
Patient: Melanie Palmer Female    DOB: [birth year]   68 y.o.   MRN: [id number] Visit Date: 02/17/2019  Today's Provider: Megan Mans, MD   Chief Complaint  Patient presents with  . Nasal Congestion  . Headache   Subjective:     HPI  Patient has been experiencing head congestion and headaches for 4 days. She is currently Catering manager for the cough.   Virtual Visit via Telephone Note  I discussed the limitations, risks, security and privacy concerns of performing an evaluation and management service by telephone and the availability of in person appointments. I also discussed with the patient that there may be a patient responsible charge related to this service. The patient expressed understanding and agreed to proceed.  I discussed the assessment and treatment plan with the patient. The patient was provided an opportunity to ask questions and all were answered. The patient agreed with the plan and demonstrated an understanding of the instructions.   The patient was advised to call back or seek an in-person evaluation if the symptoms worsen or if the condition fails to improve as anticipated.   Allergies  Allergen Reactions  . Sulfa Antibiotics Hives  . Penicillins Rash     Current Outpatient Medications:  .  clonazePAM (KLONOPIN) 0.5 MG tablet, Take 0.5 mg by mouth 2 (two) times daily. Reported on 05/10/2015, Disp: , Rfl:  .  conjugated estrogens (PREMARIN) vaginal cream, Place 1 Applicatorful vaginally daily., Disp: 42.5 g, Rfl: 12 .  ferrous sulfate 325 (65 FE) MG EC tablet, Take 325 mg by mouth daily with breakfast. , Disp: , Rfl:  .  hydrOXYzine (VISTARIL) 25 MG capsule, Take 25 mg by mouth daily., Disp: , Rfl:  .  loratadine (CLARITIN) 10 MG tablet, Take 10 mg by mouth daily as needed for allergies., Disp: , Rfl:  .  meloxicam (MOBIC) 7.5 MG tablet, Take 1 tablet (7.5 mg total) by mouth daily., Disp: 30 tablet, Rfl: 0 .  omeprazole (PRILOSEC) 20 MG  capsule, TAKE ONE CAPSULE BY MOUTH ONCE DAILY, Disp: 90 capsule, Rfl: 3 .  rosuvastatin (CRESTOR) 5 MG tablet, TAKE 1 TABLET BY MOUTH ONCE DAILY, Disp: 90 tablet, Rfl: 3 .  sertraline (ZOLOFT) 100 MG tablet, Take 100 mg by mouth daily. , Disp: , Rfl:  .  traZODone (DESYREL) 50 MG tablet, Take 50 mg by mouth at bedtime., Disp: , Rfl:  .  venlafaxine XR (EFFEXOR-XR) 150 MG 24 hr capsule, Take 150 mg by mouth daily., Disp: , Rfl: 0 .  doxycycline (VIBRA-TABS) 100 MG tablet, Take 1 tablet (100 mg total) by mouth 2 (two) times daily. (Patient not taking: Reported on 08/17/2017), Disp: 20 tablet, Rfl: 1  Review of Systems  HENT: Positive for ear pain, postnasal drip, rhinorrhea and sinus pressure.   Respiratory: Positive for cough.   Cardiovascular: Negative.   Neurological: Positive for headaches.    Social History   Tobacco Use  . Smoking status: Never Smoker  . Smokeless tobacco: Never Used  Substance Use Topics  . Alcohol use: No      Objective:   There were no vitals taken for this visit. There were no vitals filed for this visit.There is no height or weight on file to calculate BMI.   Physical Exam   No results found for any visits on 02/17/19.     Assessment & Plan    1. Subacute pansinusitis Cover with doxycycline but this is worked well in  the past.  Patient is more worried about ear infection with ear pain.  2. Cough Order COVID due to persistent symptoms for the last 5 days.  No known exposure. - Novel Coronavirus, NAA ([facility])  3. Acute pansinusitis, recurrence not specified  - doxycycline (VIBRA-TABS) 100 MG tablet; Take 1 tablet (100 mg total) by mouth 2 (two) times daily.  Dispense: 20 tablet; Refill: 1 - Novel Coronavirus, NAA ([facility])  4. OME (otitis media with effusion), bilateral Try Flonase nasal spray.     Richard Cranford Mon, MD  [facility]

## 2019-02-18 LAB — NOVEL CORONAVIRUS, NAA: SARS-CoV-2, NAA: NOT DETECTED

## 2019-02-21 ENCOUNTER — Telehealth: Payer: Self-pay | Admitting: Family Medicine

## 2019-02-21 NOTE — Telephone Encounter (Signed)
Advised patient of results.  

## 2019-02-21 NOTE — Telephone Encounter (Signed)
Covid negative

## 2019-02-21 NOTE — Telephone Encounter (Signed)
Please review. Thanks!  

## 2019-02-21 NOTE — Telephone Encounter (Signed)
Pt calling back for her COVID test results.  Please call pt back at [phone]. Pt needing to know ASAP due to an event she has tonight.  Thanks, [employer]

## 2019-03-01 ENCOUNTER — Ambulatory Visit: Payer: Self-pay

## 2019-03-02 DIAGNOSIS — F410 Panic disorder [episodic paroxysmal anxiety] without agoraphobia: Secondary | ICD-10-CM | POA: Diagnosis not present

## 2019-03-02 DIAGNOSIS — F331 Major depressive disorder, recurrent, moderate: Secondary | ICD-10-CM | POA: Diagnosis not present

## 2019-03-02 DIAGNOSIS — F411 Generalized anxiety disorder: Secondary | ICD-10-CM | POA: Diagnosis not present

## 2019-03-15 ENCOUNTER — Ambulatory Visit (INDEPENDENT_AMBULATORY_CARE_PROVIDER_SITE_OTHER): Payer: Medicare Other

## 2019-03-15 ENCOUNTER — Other Ambulatory Visit: Payer: Self-pay

## 2019-03-15 DIAGNOSIS — Z23 Encounter for immunization: Secondary | ICD-10-CM | POA: Diagnosis not present

## 2019-03-22 NOTE — Progress Notes (Signed)
Subjective:   Melanie Palmer is a 68 y.o. female who presents for Medicare Annual (Subsequent) preventive examination.  Review of Systems:  N/A  Cardiac Risk Factors include: advanced age (>95men, >27 women);dyslipidemia     Objective:     Vitals: There were no vitals taken for this visit.  There is no height or weight on file to calculate BMI.  Advanced Directives 03/23/2019 03/19/2018 03/18/2017 11/14/2016 04/28/2016 03/14/2016 08/14/2015  Does Patient Have a Medical Advance Directive? Yes Yes Yes Yes Yes Yes Yes  Type of Paramedic of [place];Living will [place];Living will Living will;Healthcare Power of [place];Living will - - [place];Living will  Copy of [place] in Chart? No - copy requested No - copy requested - No - copy requested - No - copy requested -    Tobacco Social History   Tobacco Use  Smoking Status Never Smoker  Smokeless Tobacco Never Used     Counseling given: Not Answered   Clinical Intake:  Pre-visit preparation completed: Yes  Pain : No/denies pain Pain Score: 0-No pain     Nutritional Risks: None Diabetes: No  How often do you need to have someone help you when you read instructions, pamphlets, or other written materials from your doctor or pharmacy?: 1 - Never  Interpreter Needed?: No  Comments: Mmarkoski, LPN  Past Medical History:  Diagnosis Date  . Anxiety   . Depression   . GERD (gastroesophageal reflux disease)   . Hypercholesteremia    Past Surgical History:  Procedure Laterality Date  . BREAST CYST EXCISION Left 1970's  . BREAST EXCISIONAL BIOPSY    . CESAREAN SECTION    . COLONOSCOPY    . HEEL SPUR SURGERY    . UPPER GI ENDOSCOPY  09/16/12   normal, no H Pylori, no need to repeat of EGD.   Family History  Problem Relation Age of Onset  . Hypertension Mother   . Melanoma Mother   . Heart disease  Father   . Congestive Heart Failure Father   . Heart disease Sister   . Heart disease Son   . Breast cancer Other    Social History   Socioeconomic History  . Marital status: Married    Spouse name: Not on file  . Number of children: 2  . Years of education: Not on file  . Highest education level: High school graduate  Occupational History  . Not on file  Social Needs  . Financial resource strain: Not hard at all  . Food insecurity    Worry: Never true    Inability: Never true  . Transportation needs    Medical: No    Non-medical: No  Tobacco Use  . Smoking status: Never Smoker  . Smokeless tobacco: Never Used  Substance and Sexual Activity  . Alcohol use: No  . Drug use: No  . Sexual activity: Not Currently    Birth control/protection: Post-menopausal  Lifestyle  . Physical activity    Days per week: 2 days    Minutes per session: 120 min  . Stress: Not at all  Relationships  . Social Herbalist on phone: Patient refused    Gets together: Patient refused    Attends religious service: Patient refused    Active member of club or organization: Patient refused    Attends meetings of clubs or organizations: Patient refused    Relationship status: Patient refused  Other Topics Concern  . Not on file  Social History Narrative  . Not on file    Outpatient Encounter Medications as of 03/23/2019  Medication Sig  . clonazePAM (KLONOPIN) 0.5 MG tablet Take 0.25 mg by mouth 2 (two) times daily as needed. Reported on 05/10/2015  . ferrous sulfate 325 (65 FE) MG EC tablet Take 325 mg by mouth daily with breakfast.   . hydrOXYzine (VISTARIL) 25 MG capsule Take 25 mg by mouth daily.  Marland Kitchen loratadine (CLARITIN) 10 MG tablet Take 10 mg by mouth daily as needed for allergies.  . meloxicam (MOBIC) 7.5 MG tablet Take 1 tablet (7.5 mg total) by mouth daily.  Marland Kitchen omeprazole (PRILOSEC) 20 MG capsule TAKE ONE CAPSULE BY MOUTH ONCE DAILY  . rosuvastatin (CRESTOR) 5 MG tablet TAKE  1 TABLET BY MOUTH ONCE DAILY  . sertraline (ZOLOFT) 100 MG tablet Take 100 mg by mouth daily.   . traZODone (DESYREL) 50 MG tablet Take 50 mg by mouth at bedtime.  Marland Kitchen venlafaxine XR (EFFEXOR-XR) 150 MG 24 hr capsule Take 150 mg by mouth daily.  Marland Kitchen conjugated estrogens (PREMARIN) vaginal cream Place 1 Applicatorful vaginally daily. (Patient not taking: Reported on 03/23/2019)  . doxycycline (VIBRA-TABS) 100 MG tablet Take 1 tablet (100 mg total) by mouth 2 (two) times daily. (Patient not taking: Reported on 03/23/2019)  . doxycycline (VIBRA-TABS) 100 MG tablet Take 1 tablet (100 mg total) by mouth 2 (two) times daily. (Patient not taking: Reported on 03/23/2019)  . fluticasone (FLONASE) 50 MCG/ACT nasal spray Place 2 sprays into both nostrils daily. (Patient not taking: Reported on 03/23/2019)   No facility-administered encounter medications on file as of 03/23/2019.     Activities of Daily Living In your present state of health, do you have any difficulty performing the following activities: 03/23/2019  Hearing? N  Vision? N  Comment Wears eye glasses daily.  Difficulty concentrating or making decisions? N  Walking or climbing stairs? N  Dressing or bathing? N  Doing errands, shopping? N  Preparing Food and eating ? N  Using the Toilet? N  In the past six months, have you accidently leaked urine? N  Do you have problems with loss of bowel control? N  Managing your Medications? N  Managing your Finances? N  Housekeeping or managing your Housekeeping? N  Some recent data might be hidden    Patient Care Team: Maple Hudson., MD as PCP - General (Family Medicine) Dingeldein, Viviann Spare, MD as Consulting Physician (Ophthalmology) Darliss Ridgel, MD as Referring Physician (Psychiatry)    Assessment:   This is a routine wellness examination for Melanie Palmer.  Exercise Activities and Dietary recommendations Current Exercise Habits: Structured exercise class, Type of exercise: strength  training/weights;treadmill;walking, Time (Minutes): > 60, Frequency (Times/Week): 2, Weekly Exercise (Minutes/Week): 0, Intensity: Moderate, Exercise limited by: None identified  Goals    . Increase water intake     Starting 03/14/16, I will increase my water intake to 8 glasses a day.       Fall Risk: Fall Risk  03/23/2019 03/19/2018 11/14/2016 03/14/2016 05/10/2015  Falls in the past year? 0 0 No No No  Number falls in past yr: 0 0 - - -  Injury with Fall? 0 0 - - -    FALL RISK PREVENTION PERTAINING TO THE HOME:  Any stairs in or around the home? Yes  If so, are there any without handrails? No   Home free of loose throw rugs in walkways, pet  beds, electrical cords, etc? Yes  Adequate lighting in your home to reduce risk of falls? Yes   ASSISTIVE DEVICES UTILIZED TO PREVENT FALLS:  Life alert? No  Use of a cane, walker or w/c? No  Grab bars in the bathroom? No  Shower chair or bench in shower? No  Elevated toilet seat or a handicapped toilet? No    TIMED UP AND GO:  Was the test performed? No .    Depression Screen PHQ 2/9 Scores 03/23/2019 03/19/2018 03/19/2018 02/16/2017  PHQ - 2 Score 0 0 0 0  PHQ- 9 Score - 0 - 1     Cognitive Function: Declined today.      6CIT Screen 11/14/2016 03/14/2016  What Year? 0 points 0 points  What month? 0 points 0 points  What time? 0 points 0 points  Count back from 20 0 points 0 points  Months in reverse 0 points 0 points  Repeat phrase 4 points 2 points  Total Score 4 2    Immunization History  Administered Date(s) Administered  . Fluad Quad(high Dose 65+) 03/15/2019  . Influenza Whole 01/28/2016  . Influenza, High Dose Seasonal PF 02/09/2015, 02/16/2017, 03/19/2018  . Pneumococcal Conjugate-13 07/06/2014, 02/09/2015  . Pneumococcal Polysaccharide-23 03/14/2016  . Tdap 10/28/2011  . Zoster 10/28/2011    Qualifies for Shingles Vaccine? Yes  Zostavax completed 10/28/11. Due for Shingrix.   Tdap: Up to date  Flu  Vaccine: Up to date  Pneumococcal Vaccine: Completed series  Screening Tests Health Maintenance  Topic Date Due  . DEXA SCAN  06/02/2019  . MAMMOGRAM  12/02/2020  . TETANUS/TDAP  10/27/2021  . COLONOSCOPY  02/13/2023  . INFLUENZA VACCINE  Completed  . Hepatitis C Screening  Completed  . PNA vac Low Risk Adult  Completed    Cancer Screenings:  Colorectal Screening: Completed 02/12/18. Repeat every 5 years.  Mammogram: Completed 12/03/18.   Bone Density: Completed 06/01/14. Results reflect OSTEOPENIA. Repeat every 5 years.   Lung Cancer Screening: (Low Dose CT Chest recommended if Age 89-80 years, 30 pack-year currently smoking OR have quit w/in 15years.) does not qualify.   Additional Screening:  Hepatitis C Screening: Up to date  Vision Screening: Recommended annual ophthalmology exams for early detection of glaucoma and other disorders of the eye.  Dental Screening: Recommended annual dental exams for proper oral hygiene  Community Resource Referral:  CRR required this visit?  No       Plan:  I have personally reviewed and addressed the Medicare Annual Wellness questionnaire and have noted the following in the patient's chart:  A. Medical and social history B. Use of alcohol, tobacco or illicit drugs  C. Current medications and supplements D. Functional ability and status E.  Nutritional status F.  Physical activity G. Advance directives H. List of other physicians I.  Hospitalizations, surgeries, and ER visits in previous 12 months J.  Vitals K. Screenings such as hearing and vision if needed, cognitive and depression L. Referrals and appointments   In addition, I have reviewed and discussed with patient certain preventive protocols, quality metrics, and best practice recommendations. A written personalized care plan for preventive services as well as general preventive health recommendations were provided to patient. Nurse Health Advisor  Signed,     Balthazar Dooly PatrickMarkoski, [place]  [id number]/1/191411/08/2018 Nurse Health Advisor   Nurse Notes: None.

## 2019-03-23 ENCOUNTER — Other Ambulatory Visit: Payer: Self-pay

## 2019-03-23 ENCOUNTER — Ambulatory Visit (INDEPENDENT_AMBULATORY_CARE_PROVIDER_SITE_OTHER): Payer: Medicare Other

## 2019-03-23 DIAGNOSIS — Z0000 Encounter for general adult medical examination without abnormal findings: Secondary | ICD-10-CM

## 2019-03-23 NOTE — Patient Instructions (Signed)
[name] , Thank you for taking time to come for your Medicare Wellness Visit. I appreciate your ongoing commitment to your health goals. Please review the following plan we discussed and let me know if I can assist you in the future.   Screening recommendations/referrals: Colonoscopy: Up to date, due 01/2023 Mammogram: Up to date, due 11/2020 Bone Density: Up to date, due 05/2019 Recommended yearly ophthalmology/optometry visit for glaucoma screening and checkup Recommended yearly dental visit for hygiene and checkup  Vaccinations: Influenza vaccine: Up to date Pneumococcal vaccine: Completed series Tdap vaccine: Up to date, due 10/2021 Shingles vaccine: Pt declines today.     Advanced directives: Please bring a copy of your POA (Power of Attorney) and/or Living Will to your next appointment.   Conditions/risks identified: Continue to increase water intake to 6-8 8 oz glasses a day.  Next appointment: 03/29/19 @ 9:00 AM with Dr [name].    Preventive Care 45 Years and Older, Female Preventive care refers to lifestyle choices and visits with your health care provider that can promote health and wellness. What does preventive care include?  A yearly physical exam. This is also called an annual well check.  Dental exams once or twice a year.  Routine eye exams. Ask your health care provider how often you should have your eyes checked.  Personal lifestyle choices, including:  Daily care of your teeth and gums.  Regular physical activity.  Eating a healthy diet.  Avoiding tobacco and drug use.  Limiting alcohol use.  Practicing safe sex.  Taking low-dose aspirin every day.  Taking vitamin and mineral supplements as recommended by your health care provider. What happens during an annual well check? The services and screenings done by your health care provider during your annual well check will depend on your age, overall health, lifestyle risk factors, and family history  of disease. Counseling  Your health care provider may ask you questions about your:  Alcohol use.  Tobacco use.  Drug use.  Emotional well-being.  Home and relationship well-being.  Sexual activity.  Eating habits.  History of falls.  Memory and ability to understand (cognition).  Work and work Statistician.  Reproductive health. Screening  You may have the following tests or measurements:  Height, weight, and BMI.  Blood pressure.  Lipid and cholesterol levels. These may be checked every 5 years, or more frequently if you are over 83 years old.  Skin check.  Lung cancer screening. You may have this screening every year starting at age 61 if you have a 30-pack-year history of smoking and currently smoke or have quit within the past 15 years.  Fecal occult blood test (FOBT) of the stool. You may have this test every year starting at age 33.  Flexible sigmoidoscopy or colonoscopy. You may have a sigmoidoscopy every 5 years or a colonoscopy every 10 years starting at age 24.  Hepatitis C blood test.  Hepatitis B blood test.  Sexually transmitted disease (STD) testing.  Diabetes screening. This is done by checking your blood sugar (glucose) after you have not eaten for a while (fasting). You may have this done every 1-3 years.  Bone density scan. This is done to screen for osteoporosis. You may have this done starting at age 38.  Mammogram. This may be done every 1-2 years. Talk to your health care provider about how often you should have regular mammograms. Talk with your health care provider about your test results, treatment options, and if necessary, the need for  more tests. Vaccines  Your health care provider may recommend certain vaccines, such as:  Influenza vaccine. This is recommended every year.  Tetanus, diphtheria, and acellular pertussis (Tdap, Td) vaccine. You may need a Td booster every 10 years.  Zoster vaccine. You may need this after age 57.   Pneumococcal 13-valent conjugate (PCV13) vaccine. One dose is recommended after age 8.  Pneumococcal polysaccharide (PPSV23) vaccine. One dose is recommended after age 56. Talk to your health care provider about which screenings and vaccines you need and how often you need them. This information is not intended to replace advice given to you by your health care provider. Make sure you discuss any questions you have with your health care provider. Document Released: 06/01/2015 Document Revised: 01/23/2016 Document Reviewed: 03/06/2015 Elsevier Interactive Patient Education  2017 [place] Prevention in the Home Falls can cause injuries. They can happen to people of all ages. There are many things you can do to make your home safe and to help prevent falls. What can I do on the outside of my home?  Regularly fix the edges of walkways and driveways and fix any cracks.  Remove anything that might make you trip as you walk through a door, such as a raised step or threshold.  Trim any bushes or trees on the path to your home.  Use bright outdoor lighting.  Clear any walking paths of anything that might make someone trip, such as rocks or tools.  Regularly check to see if handrails are loose or broken. Make sure that both sides of any steps have handrails.  Any raised decks and porches should have guardrails on the edges.  Have any leaves, snow, or ice cleared regularly.  Use sand or salt on walking paths during winter.  Clean up any spills in your garage right away. This includes oil or grease spills. What can I do in the bathroom?  Use night lights.  Install grab bars by the toilet and in the tub and shower. Do not use towel bars as grab bars.  Use non-skid mats or decals in the tub or shower.  If you need to sit down in the shower, use a plastic, non-slip stool.  Keep the floor dry. Clean up any water that spills on the floor as soon as it happens.  Remove soap  buildup in the tub or shower regularly.  Attach bath mats securely with double-sided non-slip rug tape.  Do not have throw rugs and other things on the floor that can make you trip. What can I do in the bedroom?  Use night lights.  Make sure that you have a light by your bed that is easy to reach.  Do not use any sheets or blankets that are too big for your bed. They should not hang down onto the floor.  Have a firm chair that has side arms. You can use this for support while you get dressed.  Do not have throw rugs and other things on the floor that can make you trip. What can I do in the kitchen?  Clean up any spills right away.  Avoid walking on wet floors.  Keep items that you use a lot in easy-to-reach places.  If you need to reach something above you, use a strong step stool that has a grab bar.  Keep electrical cords out of the way.  Do not use floor polish or wax that makes floors slippery. If you must use wax, use non-skid  floor wax.  Do not have throw rugs and other things on the floor that can make you trip. What can I do with my stairs?  Do not leave any items on the stairs.  Make sure that there are handrails on both sides of the stairs and use them. Fix handrails that are broken or loose. Make sure that handrails are as long as the stairways.  Check any carpeting to make sure that it is firmly attached to the stairs. Fix any carpet that is loose or worn.  Avoid having throw rugs at the top or bottom of the stairs. If you do have throw rugs, attach them to the floor with carpet tape.  Make sure that you have a light switch at the top of the stairs and the bottom of the stairs. If you do not have them, ask someone to add them for you. What else can I do to help prevent falls?  Wear shoes that:  Do not have high heels.  Have rubber bottoms.  Are comfortable and fit you well.  Are closed at the toe. Do not wear sandals.  If you use a stepladder:  Make  sure that it is fully opened. Do not climb a closed stepladder.  Make sure that both sides of the stepladder are locked into place.  Ask someone to hold it for you, if possible.  Clearly mark and make sure that you can see:  Any grab bars or handrails.  First and last steps.  Where the edge of each step is.  Use tools that help you move around (mobility aids) if they are needed. These include:  Canes.  Walkers.  Scooters.  Crutches.  Turn on the lights when you go into a dark area. Replace any light bulbs as soon as they burn out.  Set up your furniture so you have a clear path. Avoid moving your furniture around.  If any of your floors are uneven, fix them.  If there are any pets around you, be aware of where they are.  Review your medicines with your doctor. Some medicines can make you feel dizzy. This can increase your chance of falling. Ask your doctor what other things that you can do to help prevent falls. This information is not intended to replace advice given to you by your health care provider. Make sure you discuss any questions you have with your health care provider. Document Released: 03/01/2009 Document Revised: 10/11/2015 Document Reviewed: 06/09/2014 Elsevier Interactive Patient Education  2017 [employer].

## 2019-03-28 NOTE — Progress Notes (Signed)
Patient: Melanie Palmer, Female    DOB: 13-Jan-1951, 68 y.o.   MRN: [id number] Visit Date: 03/29/2019  Today's Provider: Megan Mansichard  Jr, MD   Chief Complaint  Patient presents with  . Annual Exam   Subjective:     Patient had AWV with [facility] on 03/23/2019.   Complete Physical Melanie Palmer is a 68 y.o. female. She feels well. She reports exercising Once a week to the gym. She reports she is sleeping fairly well.  -----------------------------------------------------------  Pap: 03/18/2017= Normal Colonoscopy: 02/12/2018  Review of Systems  Constitutional: Negative.   HENT: Negative.   Eyes: Negative.   Respiratory: Negative.   Cardiovascular: Negative.   Gastrointestinal: Negative.   Endocrine: Negative.   Genitourinary: Negative.   Musculoskeletal: Negative.   Skin: Negative.   Allergic/Immunologic: Negative.   Neurological: Negative.   Hematological: Negative.   Psychiatric/Behavioral: Negative.     Social History   Socioeconomic History  . Marital status: Married    Spouse name: Not on file  . Number of children: 2  . Years of education: Not on file  . Highest education level: High school graduate  Occupational History  . Not on file  Social Needs  . Financial resource strain: Not hard at all  . Food insecurity    Worry: Never true    Inability: Never true  . Transportation needs    Medical: No    Non-medical: No  Tobacco Use  . Smoking status: Never Smoker  . Smokeless tobacco: Never Used  Substance and Sexual Activity  . Alcohol use: No  . Drug use: No  . Sexual activity: Not Currently    Birth control/protection: Post-menopausal  Lifestyle  . Physical activity    Days per week: 2 days    Minutes per session: 120 min  . Stress: Not at all  Relationships  . Social Musicianconnections    Talks on phone: Patient refused    Gets together: Patient refused    Attends religious service: Patient refused    Active member of club or  organization: Patient refused    Attends meetings of clubs or organizations: Patient refused    Relationship status: Patient refused  . Intimate partner violence    Fear of current or ex partner: Patient refused    Emotionally abused: Patient refused    Physically abused: Patient refused    Forced sexual activity: Patient refused  Other Topics Concern  . Not on file  Social History Narrative  . Not on file    Past Medical History:  Diagnosis Date  . Anxiety   . Depression   . GERD (gastroesophageal reflux disease)   . Hypercholesteremia      Patient Active Problem List   Diagnosis Date Noted  . Urethral caruncle 03/10/2017  . Vaginal atrophy 03/10/2017  . Allergic rhinitis 05/03/2015  . Insomnia, persistent 05/03/2015  . Anxiety, generalized 05/03/2015  . Acid reflux 05/03/2015  . HLD (hyperlipidemia) 05/03/2015  . Adult hypothyroidism 05/03/2015  . Mild episode of recurrent major depressive disorder (HCC) 05/03/2015  . Panic attack 05/03/2015  . Apnea, sleep 05/03/2015    Past Surgical History:  Procedure Laterality Date  . BREAST CYST EXCISION Left 1970's  . BREAST EXCISIONAL BIOPSY    . CESAREAN SECTION    . COLONOSCOPY    . HEEL SPUR SURGERY    . UPPER GI ENDOSCOPY  09/16/12   normal, no H Pylori, no need to repeat of EGD.  Her family history includes Breast cancer in an other family member; Congestive Heart Failure in her father; Heart disease in her father, sister, and son; Hypertension in her mother; Melanoma in her mother.   Current Outpatient Medications:  .  clonazePAM (KLONOPIN) 0.5 MG tablet, Take 0.25 mg by mouth 2 (two) times daily as needed. Reported on 05/10/2015, Disp: , Rfl:  .  conjugated estrogens (PREMARIN) vaginal cream, Place 1 Applicatorful vaginally daily. (Patient not taking: Reported on 03/23/2019), Disp: 42.5 g, Rfl: 12 .  doxycycline (VIBRA-TABS) 100 MG tablet, Take 1 tablet (100 mg total) by mouth 2 (two) times daily. (Patient not  taking: Reported on 03/23/2019), Disp: 20 tablet, Rfl: 0 .  doxycycline (VIBRA-TABS) 100 MG tablet, Take 1 tablet (100 mg total) by mouth 2 (two) times daily. (Patient not taking: Reported on 03/23/2019), Disp: 20 tablet, Rfl: 1 .  ferrous sulfate 325 (65 FE) MG EC tablet, Take 325 mg by mouth daily with breakfast. , Disp: , Rfl:  .  fluticasone (FLONASE) 50 MCG/ACT nasal spray, Place 2 sprays into both nostrils daily. (Patient not taking: Reported on 03/23/2019), Disp: 16 g, Rfl: 6 .  hydrOXYzine (VISTARIL) 25 MG capsule, Take 25 mg by mouth daily., Disp: , Rfl:  .  loratadine (CLARITIN) 10 MG tablet, Take 10 mg by mouth daily as needed for allergies., Disp: , Rfl:  .  meloxicam (MOBIC) 7.5 MG tablet, Take 1 tablet (7.5 mg total) by mouth daily., Disp: 30 tablet, Rfl: 0 .  omeprazole (PRILOSEC) 20 MG capsule, TAKE ONE CAPSULE BY MOUTH ONCE DAILY, Disp: 90 capsule, Rfl: 3 .  rosuvastatin (CRESTOR) 5 MG tablet, TAKE 1 TABLET BY MOUTH ONCE DAILY, Disp: 90 tablet, Rfl: 3 .  sertraline (ZOLOFT) 100 MG tablet, Take 100 mg by mouth daily. , Disp: , Rfl:  .  traZODone (DESYREL) 50 MG tablet, Take 50 mg by mouth at bedtime., Disp: , Rfl:  .  venlafaxine XR (EFFEXOR-XR) 150 MG 24 hr capsule, Take 150 mg by mouth daily., Disp: , Rfl: 0  Patient Care Team: Maple Hudson., MD as PCP - General (Family Medicine) Dingeldein, Viviann Spare, MD as Consulting Physician (Ophthalmology) Darliss Ridgel, MD as Referring Physician (Psychiatry)     Objective:    Vitals: BP 117/64   Pulse 71   Temp (!) 96.9 F (36.1 C) (Temporal)   Resp 16   Ht 5' 1.5" (1.562 m)   Wt 121 lb 9.6 oz (55.2 kg)   BMI 22.60 kg/m   Physical Exam Vitals signs reviewed.  Constitutional:      Appearance: She is well-developed.  HENT:     Head: Normocephalic and atraumatic.     Right Ear: External ear normal.     Left Ear: External ear normal.     Nose: Nose normal.  Eyes:     General: No scleral icterus.    Conjunctiva/sclera:  Conjunctivae normal.  Neck:     Thyroid: No thyromegaly.  Cardiovascular:     Rate and Rhythm: Normal rate and regular rhythm.     Heart sounds: Normal heart sounds.  Pulmonary:     Effort: Pulmonary effort is normal.     Breath sounds: Normal breath sounds.  Abdominal:     Palpations: Abdomen is soft.  Skin:    General: Skin is warm and dry.  Neurological:     Mental Status: She is alert and oriented to person, place, and time.  Psychiatric:        Behavior:  Behavior normal.        Thought Content: Thought content normal.        Judgment: Judgment normal.     Activities of Daily Living In your present state of health, do you have any difficulty performing the following activities: 03/23/2019  Hearing? N  Vision? N  Comment Wears eye glasses daily.  Difficulty concentrating or making decisions? N  Walking or climbing stairs? N  Dressing or bathing? N  Doing errands, shopping? N  Preparing Food and eating ? N  Using the Toilet? N  In the past six months, have you accidently leaked urine? N  Do you have problems with loss of bowel control? N  Managing your Medications? N  Managing your Finances? N  Housekeeping or managing your Housekeeping? N  Some recent data might be hidden    Fall Risk Assessment Fall Risk  03/23/2019 03/23/2019 03/19/2018 11/14/2016 03/14/2016  Falls in the past year? 0 0 0 No No  Number falls in past yr: 0 0 0 - -  Injury with Fall? 0 0 0 - -     Depression Screen PHQ 2/9 Scores 03/23/2019 03/19/2018 03/19/2018 02/16/2017  PHQ - 2 Score 0 0 0 0  PHQ- 9 Score - 0 - 1    6CIT Screen 11/14/2016  What Year? 0 points  What month? 0 points  What time? 0 points  Count back from 20 0 points  Months in reverse 0 points  Repeat phrase 4 points  Total Score 4       Assessment & Plan:    Annual Physical Reviewed patient's Family Medical History Reviewed and updated list of patient's medical providers Assessment of cognitive impairment was done  Assessed patient's functional ability Established a written schedule for health screening [place] Completed and Reviewed  Exercise Activities and Dietary recommendations Goals    . Increase water intake     Starting 03/14/16, I will increase my water intake to 8 glasses a day.       Immunization History  Administered Date(s) Administered  . Fluad Quad(high Dose 65+) 03/15/2019  . Influenza Whole 01/28/2016  . Influenza, High Dose Seasonal PF 02/09/2015, 02/16/2017, 03/19/2018  . Pneumococcal Conjugate-13 07/06/2014, 02/09/2015  . Pneumococcal Polysaccharide-23 03/14/2016  . Tdap 10/28/2011  . Zoster 10/28/2011    Health Maintenance  Topic Date Due  . DEXA SCAN  06/02/2019  . MAMMOGRAM  12/02/2020  . TETANUS/TDAP  10/27/2021  . COLONOSCOPY  02/13/2023  . INFLUENZA VACCINE  Completed  . Hepatitis C Screening  Completed  . PNA vac Low Risk Adult  Completed     Discussed health benefits of physical activity, and encouraged her to engage in regular exercise appropriate for her age and condition.   1. Annual physical exam   2. Pure hypercholesterolemia  - Comprehensive Metabolic Panel (CMET) - Lipid Profile  3. Adult hypothyroidism  - CBC with Differential - TSH  ------------------------------------------------------------------------------------------------------------    Wilhemena Durie, MD  [facility]

## 2019-03-29 ENCOUNTER — Encounter: Payer: Self-pay | Admitting: Family Medicine

## 2019-03-29 ENCOUNTER — Ambulatory Visit (INDEPENDENT_AMBULATORY_CARE_PROVIDER_SITE_OTHER): Payer: Medicare Other | Admitting: Family Medicine

## 2019-03-29 ENCOUNTER — Other Ambulatory Visit: Payer: Self-pay

## 2019-03-29 VITALS — BP 117/64 | HR 71 | Temp 96.9°F | Resp 16 | Ht 61.5 in | Wt 121.6 lb

## 2019-03-29 DIAGNOSIS — E039 Hypothyroidism, unspecified: Secondary | ICD-10-CM | POA: Diagnosis not present

## 2019-03-29 DIAGNOSIS — E7800 Pure hypercholesterolemia, unspecified: Secondary | ICD-10-CM

## 2019-03-29 DIAGNOSIS — Z0000 Encounter for general adult medical examination without abnormal findings: Secondary | ICD-10-CM

## 2019-03-30 LAB — COMPREHENSIVE METABOLIC PANEL
ALT: 13 IU/L (ref 0–32)
AST: 18 IU/L (ref 0–40)
Albumin/Globulin Ratio: 2 (ref 1.2–2.2)
Albumin: 4.4 g/dL (ref 3.8–4.8)
Alkaline Phosphatase: 85 IU/L (ref 39–117)
BUN/Creatinine Ratio: 12 (ref 12–28)
BUN: 9 mg/dL (ref 8–27)
Bilirubin Total: 0.3 mg/dL (ref 0.0–1.2)
CO2: 24 mmol/L (ref 20–29)
Calcium: 9.5 mg/dL (ref 8.7–10.3)
Chloride: 101 mmol/L (ref 96–106)
Creatinine, Ser: 0.76 mg/dL (ref 0.57–1.00)
GFR calc Af Amer: 93 mL/min/{1.73_m2} (ref >59)
GFR calc non Af Amer: 80 mL/min/{1.73_m2} (ref >59)
Globulin, Total: 2.2 g/dL (ref 1.5–4.5)
Glucose: 91 mg/dL (ref 65–99)
Potassium: 3.7 mmol/L (ref 3.5–5.2)
Sodium: 139 mmol/L (ref 134–144)
Total Protein: 6.6 g/dL (ref 6.0–8.5)

## 2019-03-30 LAB — CBC WITH DIFFERENTIAL/PLATELET
Basophils Absolute: 0.1 10*3/uL (ref 0.0–0.2)
Basos: 1 %
EOS (ABSOLUTE): 0.1 10*3/uL (ref 0.0–0.4)
Eos: 2 %
Hematocrit: 34.1 % (ref 34.0–46.6)
Hemoglobin: 11.7 g/dL (ref 11.1–15.9)
Immature Grans (Abs): 0 10*3/uL (ref 0.0–0.1)
Immature Granulocytes: 0 %
Lymphocytes Absolute: 1.7 10*3/uL (ref 0.7–3.1)
Lymphs: 31 %
MCH: 32.2 pg (ref 26.6–33.0)
MCHC: 34.3 g/dL (ref 31.5–35.7)
MCV: 94 fL (ref 79–97)
Monocytes Absolute: 0.7 10*3/uL (ref 0.1–0.9)
Monocytes: 13 %
Neutrophils Absolute: 2.8 10*3/uL (ref 1.4–7.0)
Neutrophils: 53 %
Platelets: 244 10*3/uL (ref 150–450)
RBC: 3.63 x10E6/uL — ABNORMAL LOW (ref 3.77–5.28)
RDW: 11.7 % (ref 11.7–15.4)
WBC: 5.4 10*3/uL (ref 3.4–10.8)

## 2019-03-30 LAB — LIPID PANEL
Chol/HDL Ratio: 3 ratio (ref 0.0–4.4)
Cholesterol, Total: 176 mg/dL (ref 100–199)
HDL: 59 mg/dL (ref >39)
LDL Chol Calc (NIH): 103 mg/dL — ABNORMAL HIGH (ref 0–99)
Triglycerides: 77 mg/dL (ref 0–149)
VLDL Cholesterol Cal: 14 mg/dL (ref 5–40)

## 2019-03-30 LAB — TSH: TSH: 2.36 u[IU]/mL (ref 0.450–4.500)

## 2019-03-31 ENCOUNTER — Telehealth: Payer: Self-pay | Admitting: Family Medicine

## 2019-03-31 NOTE — Telephone Encounter (Signed)
Patient was advised.  

## 2019-03-31 NOTE — Telephone Encounter (Signed)
Pneumonia shot was not given to her at her CPE appt.  Please call pt back to let her know when she can have the shot at [phone].  Thanks, [employer]

## 2019-04-19 ENCOUNTER — Other Ambulatory Visit: Payer: Self-pay

## 2019-04-19 DIAGNOSIS — Z20822 Contact with and (suspected) exposure to covid-19: Secondary | ICD-10-CM

## 2019-04-21 LAB — NOVEL CORONAVIRUS, NAA: SARS-CoV-2, NAA: NOT DETECTED

## 2019-07-14 ENCOUNTER — Telehealth: Payer: Self-pay

## 2019-07-14 NOTE — Telephone Encounter (Signed)
Copied from CRM [id number]. Topic: Appointment Scheduling - Scheduling Inquiry for Clinic >> Jul 14, 2019  2:06 PM Melanie Palmer wrote: Reason for CRM: Patient states she has sinus concerns.  Patient states she is dizzy and sinus pressure.  Patient does not want a virtual appt, she states she already had one. Patient wants to come into the office.  Patient states PCP knows about her sinus history and wondering if PCP will approve her coming in Call back [phone]

## 2019-07-14 NOTE — Telephone Encounter (Signed)
Spoke with patient on the phone and she complains of sinus pain/pressure and congestion, I went over our office protocol with patient and suggested a office visit or telephone visit for patient to address with her PCP. Patient states that you are aware of her past symptoms with recurrent sinus infections and is asking if you can call her in a prescription to treat. I informed patient due to Covid we can not prescribe antibiotic without visit first.Patient would like for you to review her chart and advise. KW

## 2019-07-16 ENCOUNTER — Telehealth: Payer: Medicare HMO | Admitting: Physician Assistant

## 2019-07-16 DIAGNOSIS — J0140 Acute pansinusitis, unspecified: Secondary | ICD-10-CM | POA: Diagnosis not present

## 2019-07-16 MED ORDER — DOXYCYCLINE HYCLATE 100 MG PO TABS: 100.0000 mg | ORAL_TABLET | Freq: Two times a day (BID) | ORAL | 0 refills | Status: DC

## 2019-07-16 NOTE — Progress Notes (Signed)
We are sorry that you are not feeling well.  Here is how we plan to help!  Based on what you have shared with me it looks like you have sinusitis.  Sinusitis is inflammation and infection in the sinus cavities of the head.  Based on your presentation I believe you most likely have Acute Bacterial Sinusitis.  This is an infection caused by bacteria and is treated with antibiotics. I have prescribed Doxycycline 100mg by mouth twice a day for 10 days. You may use an oral decongestant such as Mucinex D or if you have glaucoma or high blood pressure use plain Mucinex. Saline nasal spray help and can safely be used as often as needed for congestion.  If you develop worsening sinus pain, fever or notice severe headache and vision changes, or if symptoms are not better after completion of antibiotic, please schedule an appointment with a health care provider.    Sinus infections are not as easily transmitted as other respiratory infection, however we still recommend that you avoid close contact with loved ones, especially the very young and elderly.  Remember to wash your hands thoroughly throughout the day as this is the number one way to prevent the spread of infection!  Home Care:  Only take medications as instructed by your medical team.  Complete the entire course of an antibiotic.  Do not take these medications with alcohol.  A steam or ultrasonic humidifier can help congestion.  You can place a towel over your head and breathe in the steam from hot water coming from a faucet.  Avoid close contacts especially the very young and the elderly.  Cover your mouth when you cough or sneeze.  Always remember to wash your hands.  Get Help Right Away If:  You develop worsening fever or sinus pain.  You develop a severe head ache or visual changes.  Your symptoms persist after you have completed your treatment plan.  Make sure you  Understand these instructions.  Will watch your  condition.  Will get help right away if you are not doing well or get worse.  Your e-visit answers were reviewed by a board certified advanced clinical practitioner to complete your personal care plan.  Depending on the condition, your plan could have included both over the counter or prescription medications.  If there is a problem please reply  once you have received a response from your provider.  Your safety is important to us.  If you have drug allergies check your prescription carefully.    You can use MyChart to ask questions about today's visit, request a non-urgent call back, or ask for a work or school excuse for 24 hours related to this e-Visit. If it has been greater than 24 hours you will need to follow up with your provider, or enter a new e-Visit to address those concerns.  You will get an e-mail in the next two days asking about your experience.  I hope that your e-visit has been valuable and will speed your recovery. Thank you for using e-visits.  Huxley Shurley PA-C  Approximately 5 minutes was spent documenting and reviewing patient's chart.   

## 2019-07-17 ENCOUNTER — Ambulatory Visit: Payer: Medicare HMO | Attending: Internal Medicine

## 2019-07-17 DIAGNOSIS — Z23 Encounter for immunization: Secondary | ICD-10-CM | POA: Insufficient documentation

## 2019-07-17 NOTE — Progress Notes (Signed)
   Covid-19 Vaccination Clinic  Name:  Izzie Geers    MRN: [id number] DOB: 1950-10-01  07/17/2019  Ms. Kunz was observed post Covid-19 immunization for 15 minutes without incidence. She was provided with Vaccine Information Sheet and instruction to access the V-Safe system.   Ms. Venturella was instructed to call 911 with any severe reactions post vaccine: Marland Kitchen Difficulty breathing  . Swelling of your face and throat  . A fast heartbeat  . A bad rash all over your body  . Dizziness and weakness    Immunizations Administered    Name Date Dose VIS Date Route   Pfizer COVID-19 Vaccine 07/17/2019  8:46 AM 0.3 mL 04/29/2019 Intramuscular   Manufacturer: [employer], [employer]   Lot: [id number]   NDC: [address]-3

## 2019-08-03 ENCOUNTER — Ambulatory Visit (INDEPENDENT_AMBULATORY_CARE_PROVIDER_SITE_OTHER): Payer: Medicare HMO | Admitting: Family Medicine

## 2019-08-03 DIAGNOSIS — R42 Dizziness and giddiness: Secondary | ICD-10-CM | POA: Diagnosis not present

## 2019-08-03 DIAGNOSIS — J0140 Acute pansinusitis, unspecified: Secondary | ICD-10-CM | POA: Diagnosis not present

## 2019-08-03 MED ORDER — AZITHROMYCIN 250 MG PO TABS: ORAL_TABLET | ORAL | 0 refills | Status: DC

## 2019-08-03 NOTE — Progress Notes (Signed)
Patient: Melanie Palmer Female    DOB: 09-21-1950   69 y.o.   MRN: [id number] Visit Date: 08/03/2019  Today's Provider: Megan Mans, MD   No chief complaint on file.  Subjective:     HPI Patient was treated about 2 weeks ago by telemedicine with [facility] for "sinuses".  He was treated with doxycycline.  She states she is some better but is having some nosebleeds some sinus tenderness over the frontal and ethmoid sinuses and she states she is dizzy when she stands up at times.  He denies any other symptoms.  No fevers myalgias chills or cough.  No known Covid exposure Allergies  Allergen Reactions  . Sulfa Antibiotics Hives  . Penicillins Rash     Current Outpatient Medications:  .  clonazePAM (KLONOPIN) 0.5 MG tablet, Take 0.25 mg by mouth 2 (two) times daily as needed. Reported on 05/10/2015, Disp: , Rfl:  .  conjugated estrogens (PREMARIN) vaginal cream, Place 1 Applicatorful vaginally daily. (Patient not taking: Reported on 03/23/2019), Disp: 42.5 g, Rfl: 12 .  doxycycline (VIBRA-TABS) 100 MG tablet, Take 1 tablet (100 mg total) by mouth 2 (two) times daily. 1 po bid, Disp: 20 tablet, Rfl: 0 .  ferrous sulfate 325 (65 FE) MG EC tablet, Take 325 mg by mouth daily with breakfast. , Disp: , Rfl:  .  fluticasone (FLONASE) 50 MCG/ACT nasal spray, Place 2 sprays into both nostrils daily. (Patient not taking: Reported on 03/23/2019), Disp: 16 g, Rfl: 6 .  hydrOXYzine (VISTARIL) 25 MG capsule, Take 25 mg by mouth daily., Disp: , Rfl:  .  loratadine (CLARITIN) 10 MG tablet, Take 10 mg by mouth daily as needed for allergies., Disp: , Rfl:  .  meloxicam (MOBIC) 7.5 MG tablet, Take 1 tablet (7.5 mg total) by mouth daily., Disp: 30 tablet, Rfl: 0 .  omeprazole (PRILOSEC) 20 MG capsule, TAKE ONE CAPSULE BY MOUTH ONCE DAILY, Disp: 90 capsule, Rfl: 3 .  rosuvastatin (CRESTOR) 5 MG tablet, TAKE 1 TABLET BY MOUTH ONCE DAILY, Disp: 90 tablet, Rfl: 3 .  sertraline (ZOLOFT) 100 MG  tablet, Take 100 mg by mouth daily. , Disp: , Rfl:  .  traZODone (DESYREL) 50 MG tablet, Take 50 mg by mouth at bedtime., Disp: , Rfl:  .  venlafaxine XR (EFFEXOR-XR) 150 MG 24 hr capsule, Take 150 mg by mouth daily., Disp: , Rfl: 0  Review of Systems  Social History   Tobacco Use  . Smoking status: Never Smoker  . Smokeless tobacco: Never Used  Substance Use Topics  . Alcohol use: No      Objective:   There were no vitals taken for this visit. There were no vitals filed for this visit.There is no height or weight on file to calculate BMI.   Physical Exam   No results found for any visits on 08/03/19.     Assessment & Plan    1. Acute pansinusitis, recurrence not specified I think this is viral at this time.  Z-Pak called in if she does not improve. - azithromycin (ZITHROMAX) 250 MG tablet; 2 po day 1 then 1 po days 2-5  Dispense: 6 tablet; Refill: 0  2. Dizziness Have advised patient to increase water consumption and avoid exercise until next week.  I think she is mildly orthostatic by her description.  She is not describing vertigo.  She is still having the symptoms next week we will need to see her in the office.  I do not think any of the symptoms are related to Covid.  She has had her first Covid vaccine in the next is scheduled for next week     Wilhemena Durie, MD  [place]

## 2019-08-10 ENCOUNTER — Ambulatory Visit: Payer: Medicare HMO | Attending: Internal Medicine

## 2019-08-10 DIAGNOSIS — Z23 Encounter for immunization: Secondary | ICD-10-CM

## 2019-08-10 NOTE — Progress Notes (Signed)
   Covid-19 Vaccination Clinic  Name:  Melanie Palmer    MRN: [id number] DOB: 1951/04/04  08/10/2019  [name] was observed post Covid-19 immunization for 15 minutes without incident. She was provided with Vaccine Information Sheet and instruction to access the V-Safe system.   [name] was instructed to call 911 with any severe reactions post vaccine: Marland Kitchen Difficulty breathing  . Swelling of face and throat  . A fast heartbeat  . A bad rash all over body  . Dizziness and weakness   Immunizations Administered    Name Date Dose VIS Date Route   Pfizer COVID-19 Vaccine 08/10/2019  8:27 AM 0.3 mL 04/29/2019 Intramuscular   Manufacturer: [employer], [employer]   Lot: [id number]   NDC: [address]-3

## 2019-09-02 ENCOUNTER — Other Ambulatory Visit: Payer: Self-pay | Admitting: Family Medicine

## 2019-09-02 DIAGNOSIS — H6593 Unspecified nonsuppurative otitis media, bilateral: Secondary | ICD-10-CM

## 2019-10-19 ENCOUNTER — Other Ambulatory Visit: Payer: Self-pay | Admitting: Family Medicine

## 2019-10-19 NOTE — Telephone Encounter (Signed)
Requested Prescriptions  Pending Prescriptions Disp Refills   rosuvastatin (CRESTOR) 5 MG tablet [Pharmacy Med Name: ROSUVASTATIN 5MG  TABLETS] 90 tablet 3    Sig: TAKE 1 TABLET BY MOUTH ONCE DAILY     Cardiovascular:  Antilipid - Statins Failed - 10/19/2019  3:30 AM      Failed - LDL in normal range and within 360 days    LDL Cholesterol (Calc)  Date Value Ref Range Status  03/18/2017 105 (H) mg/dL (calc) Final    Comment:    Reference range: <100 . Desirable range <100 mg/dL for primary prevention;   <70 mg/dL for patients with CHD or diabetic patients  with > or = 2 CHD risk factors. 03/20/2017 LDL-C is now calculated using the Martin-Hopkins  calculation, which is a validated novel method providing  better accuracy than the Friedewald equation in the  estimation of LDL-C.  Marland Kitchen et al. Horald Pollen. Lenox Ahr): 2061-2068  (http://education.[facility].com/faq/FAQ164)    LDL Chol Calc (NIH)  Date Value Ref Range Status  03/29/2019 103 (H) 0 - 99 mg/dL Final         Passed - Total Cholesterol in normal range and within 360 days    Cholesterol, Total  Date Value Ref Range Status  03/29/2019 176 100 - 199 mg/dL Final         Passed - HDL in normal range and within 360 days    HDL  Date Value Ref Range Status  03/29/2019 59 >39 mg/dL Final         Passed - Triglycerides in normal range and within 360 days    Triglycerides  Date Value Ref Range Status  03/29/2019 77 0 - 149 mg/dL Final         Passed - Patient is not pregnant      Passed - Valid encounter within last 12 months    Recent Outpatient Visits          2 months ago Acute pansinusitis, recurrence not specified   [facility] [place]   6 months ago Annual physical exam   [facility] [place]   8 months ago Subacute pansinusitis   [facility] MD   1 year ago Encounter for annual physical examination  excluding gynecological examination in a patient older than 17 years   [facility], [place]., MD   2 years ago Pure hypercholesterolemia   [facility] [place]

## 2019-11-03 ENCOUNTER — Other Ambulatory Visit: Payer: Self-pay | Admitting: Family Medicine

## 2019-11-03 DIAGNOSIS — K219 Gastro-esophageal reflux disease without esophagitis: Secondary | ICD-10-CM

## 2020-01-20 ENCOUNTER — Ambulatory Visit: Payer: Self-pay

## 2020-03-27 ENCOUNTER — Other Ambulatory Visit: Payer: Self-pay

## 2020-03-27 ENCOUNTER — Telehealth (INDEPENDENT_AMBULATORY_CARE_PROVIDER_SITE_OTHER): Payer: Medicare HMO | Admitting: Family Medicine

## 2020-03-27 DIAGNOSIS — J019 Acute sinusitis, unspecified: Secondary | ICD-10-CM | POA: Diagnosis not present

## 2020-03-27 DIAGNOSIS — Z23 Encounter for immunization: Secondary | ICD-10-CM | POA: Diagnosis not present

## 2020-03-27 MED ORDER — DOXYCYCLINE HYCLATE 100 MG PO TABS: 100.0000 mg | ORAL_TABLET | Freq: Two times a day (BID) | ORAL | 0 refills | Status: DC

## 2020-03-27 NOTE — Progress Notes (Signed)
This encounter was created in error - please disregard.

## 2020-03-27 NOTE — Progress Notes (Signed)
Virtual Visit via Telephone Note  I connected with Melanie Palmer on 03/27/20 at  4:00 PM EST by telephone and verified that I am speaking with the correct person using two identifiers.  Location: Patient: home Provider: office   I discussed the limitations, risks, security and privacy concerns of performing an evaluation and management service by telephone and the availability of in person appointments. I also discussed with the patient that there may be a patient responsible charge related to this service. The patient expressed understanding and agreed to proceed.   History of Present Illness: Delightful 69 year old with history of seasonal allergy calls in with 2 weeks of "sinuses".  She did have her upper wisdom tooth drilled and a crown put on the upper left tooth about a week ago.  2 weeks ago she developed drainage sinus headache sweats and/cold and some dizziness.  She has some mild pain in the ethmoid region with minimal drainage and certainly no cough.  No fevers no shortness of breath. She has had Covid vaccines.   Observations/Objective: She is able to speak in clearly him and in sentences without any disturbance.  She is alert and oriented.  She is in no acute distress.  No audible wheezing  Assessment and Plan: 1. Encounter for immunization   2. Subacute sinusitis, unspecified location Treat with doxycycline and penicillin allergic patient.  I do not think she needs Covid testing. Push fluids and rest.   Follow Up Instructions:    I discussed the assessment and treatment plan with the patient. The patient was provided an opportunity to ask questions and all were answered. The patient agreed with the plan and demonstrated an understanding of the instructions.   The patient was advised to call back or seek an in-person evaluation if the symptoms worsen or if the condition fails to improve as anticipated.  I provided 11 minutes of non-face-to-face time during this  encounter.   Megan Mans, MD

## 2020-03-30 ENCOUNTER — Ambulatory Visit: Payer: Self-pay | Admitting: *Deleted

## 2020-03-30 NOTE — Telephone Encounter (Signed)
Patient is being treated for sinus infection- she has not has sinus drainage and has only had sinus pressure. Patient is feeling that the symptoms are actually coming from stopping her Klonopin and she has been without any mood stabilizing  medication for 1 month now. Patient has reached out to psychiatrist and is expecting a call back after 1 pm today.  BP- 133/66, increased sweating- hot flashes since patient has stopped medication. Advised to await call from psychiatrist- if she feels she needs help with symptoms - ED. Patient is calm and understands  Reason for Disposition . [1] Taking antibiotic > 72 hours (3 days) AND [2] symptoms (other than fever) not improved  Answer Assessment - Initial Assessment Questions 1. INFECTION: "What infection is the antibiotic being given for?"     Sinus pressure 2. ANTIBIOTIC: "What antibiotic are you taking" "How many times per day?"     Doxycycline 3. DURATION: "When was the antibiotic started?"     Started- 03/27/20 4. MAIN CONCERN OR SYMPTOM:  "What is your main concern right now?"     Still having pressure in her head-roaring in head 5. BETTER-SAME-WORSE: "Are you getting better, staying the same, or getting worse compared to when you first started the antibiotics?" If getting worse, ask: "In what way?"      Patient feels she is getting worse 6. FEVER: "Do you have a fever?" If Yes, ask: "What is your temperature, how was it measured, and when did it start?"     no 7. SYMPTOMS: "Are there any other symptoms you're concerned about?" If Yes, ask: "When did it start?"     Head and chest pressure 8. FOLLOW-UP APPOINTMENT: "Do you have a follow-up appointment with your doctor?"     Patient has appointment on Monday  Protocols used: INFECTION ON ANTIBIOTIC FOLLOW-UP CALL-A-AH

## 2020-03-30 NOTE — Progress Notes (Signed)
Annual Wellness Visit     Patient: Melanie Palmer, Female    DOB: [birth year], 69 y.o.   MRN: [id number] Visit Date: 04/02/2020  Today's Provider: Megan Mans, MD   Chief Complaint  Patient presents with  . Annual Exam   Subjective    Melanie Palmer is a 69 y.o. female who presents today for her Annual Wellness Visit. She reports consuming a general diet.  She generally feels well. She reports sleeping well. She does not have additional problems to discuss today.   HPI Patient felt she had a panic attack several days ago.  She is discussing this with psychiatry.  She has felt woozy and had the sweats a couple of times during this past week. Her sinus symptoms she had in the past few weeks are improved.    Medications: Outpatient Medications Prior to Visit  Medication Sig  . azithromycin (ZITHROMAX) 250 MG tablet 2 po day 1 then 1 po days 2-5  . clonazePAM (KLONOPIN) 0.5 MG tablet Take 0.25 mg by mouth 2 (two) times daily as needed. Reported on 05/10/2015  . conjugated estrogens (PREMARIN) vaginal cream Place 1 Applicatorful vaginally daily.  Marland Kitchen doxycycline (VIBRA-TABS) 100 MG tablet Take 1 tablet (100 mg total) by mouth 2 (two) times daily. 1 po bid  . ferrous sulfate 325 (65 FE) MG EC tablet Take 325 mg by mouth daily with breakfast.   . fluticasone (FLONASE) 50 MCG/ACT nasal spray SHAKE LIQUID AND USE 2 SPRAYS IN EACH NOSTRIL DAILY  . hydrOXYzine (VISTARIL) 25 MG capsule Take 25 mg by mouth daily.  Marland Kitchen loratadine (CLARITIN) 10 MG tablet Take 10 mg by mouth daily as needed for allergies.  . meloxicam (MOBIC) 7.5 MG tablet Take 1 tablet (7.5 mg total) by mouth daily.  Marland Kitchen omeprazole (PRILOSEC) 20 MG capsule TAKE ONE CAPSULE BY MOUTH ONCE DAILY  . rosuvastatin (CRESTOR) 5 MG tablet TAKE 1 TABLET BY MOUTH ONCE DAILY  . sertraline (ZOLOFT) 100 MG tablet Take 100 mg by mouth daily.   . traZODone (DESYREL) 50 MG tablet Take 50 mg by mouth at bedtime.  Marland Kitchen venlafaxine XR  (EFFEXOR-XR) 150 MG 24 hr capsule Take 150 mg by mouth daily.   No facility-administered medications prior to visit.    Allergies  Allergen Reactions  . Sulfa Antibiotics Hives  . Penicillins Rash    Patient Care Team: Maple Hudson., MD as PCP - General (Family Medicine) Dingeldein, Viviann Spare, MD as Consulting Physician (Ophthalmology) Darliss Ridgel, MD as Referring Physician (Psychiatry)  Review of Systems  All other systems reviewed and are negative.     Objective    Vitals: BP 121/75 (BP Location: Right Arm, Patient Position: Sitting, Cuff Size: Normal)   Pulse 75   Temp 98.4 F (36.9 C) (Oral)   Ht 5' 1.5" (1.562 m)   Wt 120 lb (54.4 kg)   BMI 22.31 kg/m    Physical Exam Vitals reviewed.  Constitutional:      Appearance: She is well-developed.  HENT:     Head: Normocephalic and atraumatic.     Right Ear: External ear normal.     Left Ear: External ear normal.     Nose: Nose normal.  Eyes:     General: No scleral icterus.    Conjunctiva/sclera: Conjunctivae normal.  Neck:     Thyroid: No thyromegaly.  Cardiovascular:     Rate and Rhythm: Normal rate and regular rhythm.     Heart sounds: Normal heart  sounds.  Pulmonary:     Effort: Pulmonary effort is normal.     Breath sounds: Normal breath sounds.  Abdominal:     Palpations: Abdomen is soft.  Musculoskeletal:     Right lower leg: No edema.     Left lower leg: No edema.  Skin:    General: Skin is warm and dry.  Neurological:     General: No focal deficit present.     Mental Status: She is alert and oriented to person, place, and time.  Psychiatric:        Mood and Affect: Mood normal.        Behavior: Behavior normal.        Thought Content: Thought content normal.        Judgment: Judgment normal.      Most recent functional status assessment: No flowsheet data found. Most recent fall risk assessment: Fall Risk  03/23/2019  Falls in the past year? 0  Number falls in past yr: 0    Injury with Fall? 0    Most recent depression screenings: PHQ 2/9 Scores 03/23/2019 03/19/2018  PHQ - 2 Score 0 0  PHQ- 9 Score - 0   Most recent cognitive screening: 6CIT Screen 11/14/2016  What Year? 0 points  What month? 0 points  What time? 0 points  Count back from 20 0 points  Months in reverse 0 points  Repeat phrase 4 points  Total Score 4   Most recent Audit-C alcohol use screening No flowsheet data found. A score of 3 or more in women, and 4 or more in men indicates increased risk for alcohol abuse, EXCEPT if all of the points are from question 1   No results found for any visits on 04/02/20.  Assessment & Plan     Annual wellness visit done today including the all of the following: Reviewed patient's Family Medical History Reviewed and updated list of patient's medical providers Assessment of cognitive impairment was done Assessed patient's functional ability Established a written schedule for health screening services Health Risk Assessent Completed and Reviewed  Exercise Activities and Dietary recommendations Goals    . Increase water intake     Starting 03/14/16, I will increase my water intake to 8 glasses a day.       Immunization History  Administered Date(s) Administered  . Fluad Quad(high Dose 65+) 03/15/2019  . Influenza Whole 01/28/2016  . Influenza, High Dose Seasonal PF 02/09/2015, 02/16/2017, 03/19/2018  . Influenza-Unspecified 03/02/2020  . PFIZER SARS-COV-2 Vaccination 07/17/2019, 08/10/2019  . Pneumococcal Conjugate-13 07/06/2014, 02/09/2015  . Pneumococcal Polysaccharide-23 03/14/2016  . Tdap 10/28/2011  . Zoster 10/28/2011    Health Maintenance  Topic Date Due  . DEXA SCAN  06/02/2019  . MAMMOGRAM  12/02/2020  . TETANUS/TDAP  10/27/2021  . COLONOSCOPY  02/13/2023  . INFLUENZA VACCINE  Completed  . COVID-19 Vaccine  Completed  . Hepatitis C Screening  Completed  . PNA vac Low Risk Adult  Completed     Discussed health  benefits of physical activity, and encouraged her to engage in regular exercise appropriate for her age and condition.    1. Annual physical exam  - CBC w/Diff/Platelet - Comprehensive Metabolic Panel (CMET) - Lipid panel - TSH  2. Encounter for Medicare annual wellness exam  - CBC w/Diff/Platelet - Comprehensive Metabolic Panel (CMET) - Lipid panel - TSH  3. Pure hypercholesterolemia  - CBC w/Diff/Platelet - Comprehensive Metabolic Panel (CMET) - Lipid panel - TSH  4. Adult  hypothyroidism  - CBC w/Diff/Platelet - Comprehensive Metabolic Panel (CMET) - Lipid panel - TSH  5. Allergic rhinitis due to pollen, unspecified seasonality  - CBC w/Diff/Platelet - Comprehensive Metabolic Panel (CMET) - Lipid panel - TSH  6. Anxiety, generalized Per psychiatry, Dr. Maryruth Bun.  7. Panic attack    Return in about 1 year (around 04/02/2021).        Soha Thorup Wendelyn Breslow, MD  [facility] [phone] (phone) [phone] (fax)  [facility]

## 2020-04-02 ENCOUNTER — Other Ambulatory Visit: Payer: Self-pay

## 2020-04-02 ENCOUNTER — Ambulatory Visit (INDEPENDENT_AMBULATORY_CARE_PROVIDER_SITE_OTHER): Payer: Medicare HMO | Admitting: Family Medicine

## 2020-04-02 ENCOUNTER — Encounter: Payer: Self-pay | Admitting: Family Medicine

## 2020-04-02 VITALS — BP 121/75 | HR 75 | Temp 98.4°F | Ht 61.5 in | Wt 120.0 lb

## 2020-04-02 DIAGNOSIS — E039 Hypothyroidism, unspecified: Secondary | ICD-10-CM

## 2020-04-02 DIAGNOSIS — Z0000 Encounter for general adult medical examination without abnormal findings: Secondary | ICD-10-CM | POA: Diagnosis not present

## 2020-04-02 DIAGNOSIS — E7800 Pure hypercholesterolemia, unspecified: Secondary | ICD-10-CM

## 2020-04-02 DIAGNOSIS — F410 Panic disorder [episodic paroxysmal anxiety] without agoraphobia: Secondary | ICD-10-CM

## 2020-04-02 DIAGNOSIS — F411 Generalized anxiety disorder: Secondary | ICD-10-CM

## 2020-04-02 DIAGNOSIS — J301 Allergic rhinitis due to pollen: Secondary | ICD-10-CM | POA: Diagnosis not present

## 2020-04-03 LAB — CBC WITH DIFFERENTIAL/PLATELET
Basophils Absolute: 0.1 10*3/uL (ref 0.0–0.2)
Basos: 2 %
EOS (ABSOLUTE): 0.1 10*3/uL (ref 0.0–0.4)
Eos: 2 %
Hematocrit: 36.3 % (ref 34.0–46.6)
Hemoglobin: 12.5 g/dL (ref 11.1–15.9)
Immature Grans (Abs): 0 10*3/uL (ref 0.0–0.1)
Immature Granulocytes: 0 %
Lymphocytes Absolute: 1.6 10*3/uL (ref 0.7–3.1)
Lymphs: 31 %
MCH: 32.5 pg (ref 26.6–33.0)
MCHC: 34.4 g/dL (ref 31.5–35.7)
MCV: 94 fL (ref 79–97)
Monocytes Absolute: 0.6 10*3/uL (ref 0.1–0.9)
Monocytes: 11 %
Neutrophils Absolute: 2.8 10*3/uL (ref 1.4–7.0)
Neutrophils: 54 %
Platelets: 268 10*3/uL (ref 150–450)
RBC: 3.85 x10E6/uL (ref 3.77–5.28)
RDW: 11.3 % — ABNORMAL LOW (ref 11.7–15.4)
WBC: 5.1 10*3/uL (ref 3.4–10.8)

## 2020-04-03 LAB — COMPREHENSIVE METABOLIC PANEL
ALT: 9 IU/L (ref 0–32)
AST: 13 IU/L (ref 0–40)
Albumin/Globulin Ratio: 2 (ref 1.2–2.2)
Albumin: 4.3 g/dL (ref 3.8–4.8)
Alkaline Phosphatase: 79 IU/L (ref 44–121)
BUN/Creatinine Ratio: 18 (ref 12–28)
BUN: 13 mg/dL (ref 8–27)
Bilirubin Total: <0.2 mg/dL (ref 0.0–1.2)
CO2: 28 mmol/L (ref 20–29)
Calcium: 9.8 mg/dL (ref 8.7–10.3)
Chloride: 101 mmol/L (ref 96–106)
Creatinine, Ser: 0.74 mg/dL (ref 0.57–1.00)
GFR calc Af Amer: 95 mL/min/{1.73_m2} (ref >59)
GFR calc non Af Amer: 82 mL/min/{1.73_m2} (ref >59)
Globulin, Total: 2.2 g/dL (ref 1.5–4.5)
Glucose: 81 mg/dL (ref 65–99)
Potassium: 4.2 mmol/L (ref 3.5–5.2)
Sodium: 140 mmol/L (ref 134–144)
Total Protein: 6.5 g/dL (ref 6.0–8.5)

## 2020-04-03 LAB — LIPID PANEL
Chol/HDL Ratio: 3.3 ratio (ref 0.0–4.4)
Cholesterol, Total: 180 mg/dL (ref 100–199)
HDL: 55 mg/dL (ref >39)
LDL Chol Calc (NIH): 108 mg/dL — ABNORMAL HIGH (ref 0–99)
Triglycerides: 94 mg/dL (ref 0–149)
VLDL Cholesterol Cal: 17 mg/dL (ref 5–40)

## 2020-04-03 LAB — TSH: TSH: 4.03 u[IU]/mL (ref 0.450–4.500)

## 2020-04-09 ENCOUNTER — Telehealth: Payer: Self-pay | Admitting: Family Medicine

## 2020-04-09 NOTE — Telephone Encounter (Signed)
Copied from CRM [id number]. Topic: Medicare AWV >> Apr 09, 2020 10:36 AM Claudette Laws R wrote: Reason for CRM: , Left message to cancel AWVS scheduled Apr 10, 2020. New AWVS appointment will be Apr 25, 2020 at 2:00 by phone

## 2020-04-16 ENCOUNTER — Other Ambulatory Visit: Payer: Self-pay | Admitting: Family Medicine

## 2020-04-16 DIAGNOSIS — H6593 Unspecified nonsuppurative otitis media, bilateral: Secondary | ICD-10-CM

## 2020-04-20 ENCOUNTER — Other Ambulatory Visit: Payer: Self-pay | Admitting: Family Medicine

## 2020-04-24 NOTE — Progress Notes (Signed)
Subjective:   Melanie Palmer is a 69 y.o. female who presents for Medicare Annual (Subsequent) preventive examination.  I connected with Beryle Beams today by telephone and verified that I am speaking with the correct person using two identifiers. Location patient: home Location provider: work Persons participating in the virtual visit: patient, provider.   I discussed the limitations, risks, security and privacy concerns of performing an evaluation and management service by telephone and the availability of in person appointments. I also discussed with the patient that there may be a patient responsible charge related to this service. The patient expressed understanding and verbally consented to this telephonic visit.    Interactive audio and video telecommunications were attempted between this provider and patient, however failed, due to patient having technical difficulties OR patient did not have access to video capability.  We continued and completed visit with audio only.   Review of Systems    N/A  Cardiac Risk Factors include: advanced age (>1men, >21 women);dyslipidemia     Objective:    There were no vitals filed for this visit. There is no height or weight on file to calculate BMI.  Advanced Directives 04/25/2020 03/23/2019 03/19/2018 03/18/2017 11/14/2016 04/28/2016 03/14/2016  Does Patient Have a Medical Advance Directive? Yes Yes Yes Yes Yes Yes Yes  Type of Advance Directive Living will Healthcare Power of [place];Living will Healthcare Power of [place];Living will Living will;Healthcare Power of [employer] Power of [place];Living will - -  Copy of Healthcare Power of Attorney in Chart? - No - copy requested No - copy requested - No - copy requested - No - copy requested    Current Medications (verified) Outpatient Encounter Medications as of 04/25/2020  Medication Sig  . clonazePAM (KLONOPIN) 0.5 MG tablet Take 0.25 mg by mouth 2 (two) times daily as  needed. Reported on 05/10/2015  . ferrous sulfate 325 (65 FE) MG EC tablet Take 325 mg by mouth daily with breakfast.   . fluticasone (FLONASE) 50 MCG/ACT nasal spray SHAKE LIQUID AND USE 2 SPRAYS IN EACH NOSTRIL DAILY  . hydrOXYzine (VISTARIL) 25 MG capsule Take 25 mg by mouth daily.  Marland Kitchen omeprazole (PRILOSEC) 20 MG capsule TAKE ONE CAPSULE BY MOUTH ONCE DAILY  . rosuvastatin (CRESTOR) 5 MG tablet TAKE 1 TABLET BY MOUTH ONCE DAILY  . sertraline (ZOLOFT) 100 MG tablet Take 100 mg by mouth daily.   . traZODone (DESYREL) 50 MG tablet Take 50 mg by mouth at bedtime.  Marland Kitchen venlafaxine XR (EFFEXOR-XR) 150 MG 24 hr capsule Take 150 mg by mouth daily.  Marland Kitchen azithromycin (ZITHROMAX) 250 MG tablet 2 po day 1 then 1 po days 2-5 (Patient not taking: Reported on 04/25/2020)  . conjugated estrogens (PREMARIN) vaginal cream Place 1 Applicatorful vaginally daily. (Patient not taking: Reported on 04/25/2020)  . doxycycline (VIBRA-TABS) 100 MG tablet Take 1 tablet (100 mg total) by mouth 2 (two) times daily. 1 po bid (Patient not taking: Reported on 04/25/2020)  . loratadine (CLARITIN) 10 MG tablet Take 10 mg by mouth daily as needed for allergies. (Patient not taking: Reported on 04/25/2020)  . meloxicam (MOBIC) 7.5 MG tablet Take 1 tablet (7.5 mg total) by mouth daily. (Patient not taking: Reported on 04/25/2020)   No facility-administered encounter medications on file as of 04/25/2020.    Allergies (verified) Sulfa antibiotics and Penicillins   History: Past Medical History:  Diagnosis Date  . Anxiety   . Depression   . GERD (gastroesophageal reflux disease)   . Hypercholesteremia  Past Surgical History:  Procedure Laterality Date  . BREAST CYST EXCISION Left 1970's  . BREAST EXCISIONAL BIOPSY    . CESAREAN SECTION    . COLONOSCOPY    . HEEL SPUR SURGERY    . UPPER GI ENDOSCOPY  09/16/12   normal, no H Pylori, no need to repeat of EGD.   Family History  Problem Relation Age of Onset  . Hypertension  Mother   . Melanoma Mother   . Heart disease Father   . Congestive Heart Failure Father   . Heart disease Sister   . Heart disease Son   . Breast cancer Other   . Colon cancer Neg Hx   . Ovarian cancer Neg Hx    Social History   Socioeconomic History  . Marital status: Married    Spouse name: Not on file  . Number of children: 2  . Years of education: Not on file  . Highest education level: High school graduate  Occupational History  . Not on file  Tobacco Use  . Smoking status: Never Smoker  . Smokeless tobacco: Never Used  Vaping Use  . Vaping Use: Never used  Substance and Sexual Activity  . Alcohol use: No  . Drug use: No  . Sexual activity: Not Currently    Birth control/protection: Post-menopausal  Other Topics Concern  . Not on file  Social History Narrative  . Not on file   Social Determinants of Health   Financial Resource Strain: Low Risk   . Difficulty of Paying Living Expenses: Not hard at all  Food Insecurity: No Food Insecurity  . Worried About Programme researcher, broadcasting/film/video in the Last Year: Never true  . Ran Out of Food in the Last Year: Never true  Transportation Needs: No Transportation Needs  . Lack of Transportation (Medical): No  . Lack of Transportation (Non-Medical): No  Physical Activity: Sufficiently Active  . Days of Exercise per Week: 2 days  . Minutes of Exercise per Session: 120 min  Stress: No Stress Concern Present  . Feeling of Stress : Not at all  Social Connections: Moderately Integrated  . Frequency of Communication with Friends and Family: More than three times a week  . Frequency of Social Gatherings with Friends and Family: More than three times a week  . Attends Religious Services: More than 4 times per year  . Active Member of Clubs or Organizations: No  . Attends Banker Meetings: Never  . Marital Status: Married    Tobacco Counseling Counseling given: Not Answered   Clinical Intake:  Pre-visit preparation  completed: Yes  Pain : No/denies pain     Nutritional Risks: None Diabetes: No  How often do you need to have someone help you when you read instructions, pamphlets, or other written materials from your doctor or pharmacy?: 1 - Never  Diabetic? No  Interpreter Needed?: No  Information entered by :: [facility], LPN   Activities of Daily Living In your present state of health, do you have any difficulty performing the following activities: 04/25/2020  Hearing? N  Vision? N  Difficulty concentrating or making decisions? N  Walking or climbing stairs? N  Dressing or bathing? N  Doing errands, shopping? N  Preparing Food and eating ? N  Using the Toilet? N  In the past six months, have you accidently leaked urine? N  Do you have problems with loss of bowel control? N  Managing your Medications? N  Managing your Finances? N  Housekeeping or managing your Housekeeping? N  Some recent data might be hidden    Patient Care Team: Maple Hudson., MD as PCP - General (Family Medicine) Dingeldein, Viviann Spare, MD as Consulting Physician (Ophthalmology) Darliss Ridgel, MD as Referring Physician (Psychiatry)  Indicate any recent Medical Services you may have received from other than [facility] providers in the past year (date may be approximate).     Assessment:   This is a routine wellness examination for Melanie Palmer.  Hearing/Vision screen No exam data present  Dietary issues and exercise activities discussed: Current Exercise Habits: Structured exercise class, Type of exercise: treadmill;walking;strength training/weights, Time (Minutes): > 60, Frequency (Times/Week): 2, Weekly Exercise (Minutes/Week): 0, Intensity: Mild, Exercise limited by: None identified  Goals    . Increase water intake     Starting 03/14/16, I will increase my water intake to 8 glasses a day.      Depression Screen PHQ 2/9 Scores 04/25/2020 03/23/2019 03/19/2018 03/19/2018 02/16/2017 11/14/2016 11/14/2016  PHQ - 2  Score 0 0 0 0 0 0 0  PHQ- 9 Score - - 0 - 1 0 -    Fall Risk Fall Risk  04/25/2020 03/23/2019 03/23/2019 03/19/2018 11/14/2016  Falls in the past year? 0 0 0 0 No  Number falls in past yr: 0 0 0 0 -  Injury with Fall? 0 0 0 0 -    FALL RISK PREVENTION PERTAINING TO THE HOME:  Any stairs in or around the home? No  If so, are there any without handrails? No  Home free of loose throw rugs in walkways, pet beds, electrical cords, etc? Yes  Adequate lighting in your home to reduce risk of falls? Yes   ASSISTIVE DEVICES UTILIZED TO PREVENT FALLS:  Life alert? No  Use of a cane, walker or w/c? No  Grab bars in the bathroom? No  Shower chair or bench in shower? Yes  Elevated toilet seat or a handicapped toilet? Yes    Cognitive Function: Normal cognitive status assessed by observation by this Nurse Health Advisor. No abnormalities found.      6CIT Screen 11/14/2016 03/14/2016  What Year? 0 points 0 points  What month? 0 points 0 points  What time? 0 points 0 points  Count back from 20 0 points 0 points  Months in reverse 0 points 0 points  Repeat phrase 4 points 2 points  Total Score 4 2    Immunizations Immunization History  Administered Date(s) Administered  . Fluad Quad(high Dose 65+) 03/15/2019  . Influenza Whole 01/28/2016  . Influenza, High Dose Seasonal PF 02/09/2015, 02/16/2017, 03/19/2018  . Influenza-Unspecified 03/02/2020  . PFIZER SARS-COV-2 Vaccination 07/17/2019, 08/10/2019  . Pneumococcal Conjugate-13 07/06/2014, 02/09/2015  . Pneumococcal Polysaccharide-23 03/14/2016  . Tdap 10/28/2011  . Zoster 10/28/2011    TDAP status: Up to date  Flu Vaccine status: Up to date  Pneumococcal vaccine status: Up to date  Covid-19 vaccine status: Completed vaccines  Qualifies for Shingles Vaccine? Yes   Zostavax completed No   Shingrix Completed?: No.    Education has been provided regarding the importance of this vaccine. Patient has been advised to call insurance  company to determine out of pocket expense if they have not yet received this vaccine. Advised may also receive vaccine at local pharmacy or Health Dept. Verbalized acceptance and understanding.  Screening Tests Health Maintenance  Topic Date Due  . DEXA SCAN  06/02/2019  . MAMMOGRAM  12/02/2020  . TETANUS/TDAP  10/27/2021  . COLONOSCOPY  02/13/2023  . INFLUENZA VACCINE  Completed  . COVID-19 Vaccine  Completed  . Hepatitis C Screening  Completed  . PNA vac Low Risk Adult  Completed    Health Maintenance  Health Maintenance Due  Topic Date Due  . DEXA SCAN  06/02/2019    Colorectal cancer screening: Type of screening: Colonoscopy. Completed 02/12/18. Repeat every 5 years  Mammogram status: Ordered today. Pt provided with contact info and advised to call to schedule appt.   Bone Density status: Ordered today. Pt provided with contact info and advised to call to schedule appt.  Lung Cancer Screening: (Low Dose CT Chest recommended if Age 67-80 years, 30 pack-year currently smoking OR have quit w/in 15years.) does not qualify.    Additional Screening:  Hepatitis C Screening: Up to date  Vision Screening: Recommended annual ophthalmology exams for early detection of glaucoma and other disorders of the eye. Is the patient up to date with their annual eye exam?  Yes  Who is the provider or what is the name of the office in which the patient attends annual eye exams? Dr Dingeldein @ [facility] If pt is not established with a provider, would they like to be referred to a provider to establish care? No .   Dental Screening: Recommended annual dental exams for proper oral hygiene  Community Resource Referral / Chronic Care Management: CRR required this visit?  No   CCM required this visit?  No      Plan:     I have personally reviewed and noted the following in the patient's chart:   . Medical and social history . Use of alcohol, tobacco or illicit drugs  . Current medications  and supplements . Functional ability and status . Nutritional status . Physical activity . Advanced directives . List of other physicians . Hospitalizations, surgeries, and ER visits in previous 12 months . Vitals . Screenings to include cognitive, depression, and falls . Referrals and appointments  In addition, I have reviewed and discussed with patient certain preventive protocols, quality metrics, and best practice recommendations. A written personalized care plan for preventive services as well as general preventive health recommendations were provided to patient.     Evangeline Utley SiglervilleMarkoski, [place]N   21/3/086512/12/2019   Nurse Notes: None.

## 2020-04-25 ENCOUNTER — Other Ambulatory Visit: Payer: Self-pay

## 2020-04-25 ENCOUNTER — Ambulatory Visit (INDEPENDENT_AMBULATORY_CARE_PROVIDER_SITE_OTHER): Payer: Medicare HMO

## 2020-04-25 DIAGNOSIS — Z0000 Encounter for general adult medical examination without abnormal findings: Secondary | ICD-10-CM | POA: Diagnosis not present

## 2020-04-25 DIAGNOSIS — E2839 Other primary ovarian failure: Secondary | ICD-10-CM

## 2020-04-25 DIAGNOSIS — Z1231 Encounter for screening mammogram for malignant neoplasm of breast: Secondary | ICD-10-CM | POA: Diagnosis not present

## 2020-04-25 NOTE — Patient Instructions (Signed)
Melanie Palmer , Thank you for taking time to come for your Medicare Wellness Visit. I appreciate your ongoing commitment to your health goals. Please review the following plan we discussed and let me know if I can assist you in the future.   Screening recommendations/referrals: Colonoscopy: Up to date, due 01/2023 Mammogram: Ordered today. Pt aware office will contact her ZO:XWRU.  Bone Density: Ordered today. Pt aware office will contact her EA:VWUJ.  Recommended yearly ophthalmology/optometry visit for glaucoma screening and checkup Recommended yearly dental visit for hygiene and checkup  Vaccinations: Influenza vaccine: Done 03/02/20 Pneumococcal vaccine: Completed series Tdap vaccine: Up to date, due 10/2021 Shingles vaccine: Shingrix discussed. Please contact your pharmacy for coverage information.     Advanced directives: Please bring a copy of your POA (Power of Attorney) and/or Living Will to your next appointment.   Conditions/risks identified: Recommend to increase water intake to 6-8 8 oz glasses a day.   Next appointment: 04/26/20 @ 9:30 AM for a Covid booster    Preventive Care 65 Years and Older, Female Preventive care refers to lifestyle choices and visits with your health care provider that can promote health and wellness. What does preventive care include?  A yearly physical exam. This is also called an annual well check.  Dental exams once or twice a year.  Routine eye exams. Ask your health care provider how often you should have your eyes checked.  Personal lifestyle choices, including:  Daily care of your teeth and gums.  Regular physical activity.  Eating a healthy diet.  Avoiding tobacco and drug use.  Limiting alcohol use.  Practicing safe sex.  Taking low-dose aspirin every day.  Taking vitamin and mineral supplements as recommended by your health care provider. What happens during an annual well check? The services and screenings done by your  health care provider during your annual well check will depend on your age, overall health, lifestyle risk factors, and family history of disease. Counseling  Your health care provider may ask you questions about your:  Alcohol use.  Tobacco use.  Drug use.  Emotional well-being.  Home and relationship well-being.  Sexual activity.  Eating habits.  History of falls.  Memory and ability to understand (cognition).  Work and work Astronomer.  Reproductive health. Screening  You may have the following tests or measurements:  Height, weight, and BMI.  Blood pressure.  Lipid and cholesterol levels. These may be checked every 5 years, or more frequently if you are over 71 years old.  Skin check.  Lung cancer screening. You may have this screening every year starting at age 7 if you have a 30-pack-year history of smoking and currently smoke or have quit within the past 15 years.  Fecal occult blood test (FOBT) of the stool. You may have this test every year starting at age 82.  Flexible sigmoidoscopy or colonoscopy. You may have a sigmoidoscopy every 5 years or a colonoscopy every 10 years starting at age 49.  Hepatitis C blood test.  Hepatitis B blood test.  Sexually transmitted disease (STD) testing.  Diabetes screening. This is done by checking your blood sugar (glucose) after you have not eaten for a while (fasting). You may have this done every 1-3 years.  Bone density scan. This is done to screen for osteoporosis. You may have this done starting at age 93.  Mammogram. This may be done every 1-2 years. Talk to your health care provider about how often you should have regular mammograms. Talk  with your health care provider about your test results, treatment options, and if necessary, the need for more tests. Vaccines  Your health care provider may recommend certain vaccines, such as:  Influenza vaccine. This is recommended every year.  Tetanus, diphtheria, and  acellular pertussis (Tdap, Td) vaccine. You may need a Td booster every 10 years.  Zoster vaccine. You may need this after age 32.  Pneumococcal 13-valent conjugate (PCV13) vaccine. One dose is recommended after age 14.  Pneumococcal polysaccharide (PPSV23) vaccine. One dose is recommended after age 43. Talk to your health care provider about which screenings and vaccines you need and how often you need them. This information is not intended to replace advice given to you by your health care provider. Make sure you discuss any questions you have with your health care provider. Document Released: 06/01/2015 Document Revised: 01/23/2016 Document Reviewed: 03/06/2015 Elsevier Interactive Patient Education  2017 [employer] Prevention in the Home Falls can cause injuries. They can happen to people of all ages. There are many things you can do to make your home safe and to help prevent falls. What can I do on the outside of my home?  Regularly fix the edges of walkways and driveways and fix any cracks.  Remove anything that might make you trip as you walk through a door, such as a raised step or threshold.  Trim any bushes or trees on the path to your home.  Use bright outdoor lighting.  Clear any walking paths of anything that might make someone trip, such as rocks or tools.  Regularly check to see if handrails are loose or broken. Make sure that both sides of any steps have handrails.  Any raised decks and porches should have guardrails on the edges.  Have any leaves, snow, or ice cleared regularly.  Use sand or salt on walking paths during winter.  Clean up any spills in your garage right away. This includes oil or grease spills. What can I do in the bathroom?  Use night lights.  Install grab bars by the toilet and in the tub and shower. Do not use towel bars as grab bars.  Use non-skid mats or decals in the tub or shower.  If you need to sit down in the shower, use  a plastic, non-slip stool.  Keep the floor dry. Clean up any water that spills on the floor as soon as it happens.  Remove soap buildup in the tub or shower regularly.  Attach bath mats securely with double-sided non-slip rug tape.  Do not have throw rugs and other things on the floor that can make you trip. What can I do in the bedroom?  Use night lights.  Make sure that you have a light by your bed that is easy to reach.  Do not use any sheets or blankets that are too big for your bed. They should not hang down onto the floor.  Have a firm chair that has side arms. You can use this for support while you get dressed.  Do not have throw rugs and other things on the floor that can make you trip. What can I do in the kitchen?  Clean up any spills right away.  Avoid walking on wet floors.  Keep items that you use a lot in easy-to-reach places.  If you need to reach something above you, use a strong step stool that has a grab bar.  Keep electrical cords out of the way.  Do  not use floor polish or wax that makes floors slippery. If you must use wax, use non-skid floor wax.  Do not have throw rugs and other things on the floor that can make you trip. What can I do with my stairs?  Do not leave any items on the stairs.  Make sure that there are handrails on both sides of the stairs and use them. Fix handrails that are broken or loose. Make sure that handrails are as long as the stairways.  Check any carpeting to make sure that it is firmly attached to the stairs. Fix any carpet that is loose or worn.  Avoid having throw rugs at the top or bottom of the stairs. If you do have throw rugs, attach them to the floor with carpet tape.  Make sure that you have a light switch at the top of the stairs and the bottom of the stairs. If you do not have them, ask someone to add them for you. What else can I do to help prevent falls?  Wear shoes that:  Do not have high heels.  Have  rubber bottoms.  Are comfortable and fit you well.  Are closed at the toe. Do not wear sandals.  If you use a stepladder:  Make sure that it is fully opened. Do not climb a closed stepladder.  Make sure that both sides of the stepladder are locked into place.  Ask someone to hold it for you, if possible.  Clearly mark and make sure that you can see:  Any grab bars or handrails.  First and last steps.  Where the edge of each step is.  Use tools that help you move around (mobility aids) if they are needed. These include:  Canes.  Walkers.  Scooters.  Crutches.  Turn on the lights when you go into a dark area. Replace any light bulbs as soon as they burn out.  Set up your furniture so you have a clear path. Avoid moving your furniture around.  If any of your floors are uneven, fix them.  If there are any pets around you, be aware of where they are.  Review your medicines with your doctor. Some medicines can make you feel dizzy. This can increase your chance of falling. Ask your doctor what other things that you can do to help prevent falls. This information is not intended to replace advice given to you by your health care provider. Make sure you discuss any questions you have with your health care provider. Document Released: 03/01/2009 Document Revised: 10/11/2015 Document Reviewed: 06/09/2014 Elsevier Interactive Patient Education  2017 [employer].

## 2020-04-26 ENCOUNTER — Ambulatory Visit (INDEPENDENT_AMBULATORY_CARE_PROVIDER_SITE_OTHER): Payer: Medicare HMO

## 2020-04-26 ENCOUNTER — Other Ambulatory Visit: Payer: Self-pay

## 2020-04-26 DIAGNOSIS — Z23 Encounter for immunization: Secondary | ICD-10-CM | POA: Diagnosis not present

## 2020-04-29 ENCOUNTER — Other Ambulatory Visit: Payer: Self-pay | Admitting: Family Medicine

## 2020-04-29 DIAGNOSIS — K219 Gastro-esophageal reflux disease without esophagitis: Secondary | ICD-10-CM

## 2020-06-11 DIAGNOSIS — F410 Panic disorder [episodic paroxysmal anxiety] without agoraphobia: Secondary | ICD-10-CM | POA: Diagnosis not present

## 2020-06-11 DIAGNOSIS — F330 Major depressive disorder, recurrent, mild: Secondary | ICD-10-CM | POA: Diagnosis not present

## 2020-06-12 ENCOUNTER — Other Ambulatory Visit: Payer: Self-pay

## 2020-06-12 ENCOUNTER — Ambulatory Visit
Admission: RE | Admit: 2020-06-12 | Discharge: 2020-06-12 | Disposition: A | Discharge status: Home / Self Care | Payer: Medicare HMO | Source: Ambulatory Visit | Attending: Family Medicine | Admitting: Family Medicine

## 2020-06-12 DIAGNOSIS — R29890 Loss of height: Secondary | ICD-10-CM | POA: Diagnosis not present

## 2020-06-12 DIAGNOSIS — Z1231 Encounter for screening mammogram for malignant neoplasm of breast: Secondary | ICD-10-CM | POA: Insufficient documentation

## 2020-06-12 DIAGNOSIS — Z780 Asymptomatic menopausal state: Secondary | ICD-10-CM | POA: Diagnosis not present

## 2020-06-12 DIAGNOSIS — Z8739 Personal history of other diseases of the musculoskeletal system and connective tissue: Secondary | ICD-10-CM | POA: Diagnosis not present

## 2020-06-12 DIAGNOSIS — E2839 Other primary ovarian failure: Secondary | ICD-10-CM | POA: Diagnosis not present

## 2020-06-15 ENCOUNTER — Other Ambulatory Visit: Payer: Self-pay | Admitting: *Deleted

## 2020-06-15 MED ORDER — ALENDRONATE SODIUM 70 MG PO TABS: 70.0000 mg | ORAL_TABLET | ORAL | 4 refills | Status: DC

## 2020-07-03 DIAGNOSIS — F330 Major depressive disorder, recurrent, mild: Secondary | ICD-10-CM | POA: Diagnosis not present

## 2020-07-03 DIAGNOSIS — F410 Panic disorder [episodic paroxysmal anxiety] without agoraphobia: Secondary | ICD-10-CM | POA: Diagnosis not present

## 2020-07-24 DIAGNOSIS — F330 Major depressive disorder, recurrent, mild: Secondary | ICD-10-CM | POA: Diagnosis not present

## 2020-07-24 DIAGNOSIS — F410 Panic disorder [episodic paroxysmal anxiety] without agoraphobia: Secondary | ICD-10-CM | POA: Diagnosis not present

## 2020-07-26 ENCOUNTER — Telehealth: Payer: Self-pay

## 2020-07-26 ENCOUNTER — Other Ambulatory Visit: Payer: Self-pay | Admitting: *Deleted

## 2020-07-26 DIAGNOSIS — F330 Major depressive disorder, recurrent, mild: Secondary | ICD-10-CM

## 2020-07-26 DIAGNOSIS — F410 Panic disorder [episodic paroxysmal anxiety] without agoraphobia: Secondary | ICD-10-CM

## 2020-07-26 NOTE — Telephone Encounter (Signed)
Please advise 

## 2020-07-26 NOTE — Telephone Encounter (Signed)
Melanie Palmer has been seeing therapist Felecia Jan who is in with Dr. Maryruth Bun at [facility] and [facility]. In order for her [employer] to pay for this, she needs a referral.     The referral should be faxed to [phone] for review of services rendered for 06/11/20, 07/03/20 and 07/24/20.     She has another appt on 08/21/20.

## 2020-07-26 NOTE — Telephone Encounter (Signed)
Ok to refer.

## 2020-07-26 NOTE — Telephone Encounter (Signed)
Referral ordered

## 2020-08-08 NOTE — Telephone Encounter (Signed)
Pt called stating that she received a letter from [facility]. She states that they have not received the referral yet and that she has an unpaid balance. She is requesting to have PCP reach out to insurance to find out what needs to be done. Please advise.

## 2020-08-08 NOTE — Telephone Encounter (Signed)
Per Maye Hides referral information was faxed.

## 2020-08-09 NOTE — Telephone Encounter (Signed)
Called patient and requested she bring the letter from insurance to the office. Patient stated she will came by this afternoon.

## 2020-10-29 ENCOUNTER — Other Ambulatory Visit: Payer: Self-pay | Admitting: Family Medicine

## 2020-10-29 MED ORDER — ROSUVASTATIN CALCIUM 5 MG PO TABS: 5.0000 mg | ORAL_TABLET | Freq: Every day | ORAL | 3 refills | Status: DC

## 2020-10-29 NOTE — Telephone Encounter (Deleted)
p 

## 2020-10-29 NOTE — Telephone Encounter (Signed)
last RF 04/20/20 #90 3 RF

## 2020-10-29 NOTE — Telephone Encounter (Signed)
Medication Refill - Medication: rosuvastatin (CRESTOR) 5 MG tablet    (Agent: If yes, when and what did the pharmacy advise?)pharmacy says was called in in June, but no notes show this.  Preferred Pharmacy  [id number] - [place], [place] - [address] Phone:  [phone]     : Please be advised that RX refills may take up to 3 business days. We ask that you follow-up with your pharmacy.

## 2021-01-16 ENCOUNTER — Telehealth: Payer: Self-pay

## 2021-01-16 NOTE — Telephone Encounter (Signed)
Copied from CRM [id number]. Topic: General - Other >> Jan 16, 2021 12:38 PM Aretta Nip wrote: Reason for CRM: Message for Dr Sullivan Lone Pt is having left hip pain running down her leg/ Pt is on wait list and has appt but not till the end of Sept. Pt states Dr Reece Agar was neighbor and is personal friend and really only likes to see him. She would like him to know about this pain in her leg as feels he would want to know. It is really bothering her or she would not request.  CB is [phone]. You may leave a message.

## 2021-01-16 NOTE — Telephone Encounter (Signed)
Please advise 

## 2021-01-17 ENCOUNTER — Ambulatory Visit (INDEPENDENT_AMBULATORY_CARE_PROVIDER_SITE_OTHER): Payer: Medicare HMO | Admitting: Family Medicine

## 2021-01-17 ENCOUNTER — Other Ambulatory Visit: Payer: Self-pay

## 2021-01-17 ENCOUNTER — Encounter: Payer: Self-pay | Admitting: Family Medicine

## 2021-01-17 VITALS — BP 131/68 | HR 73 | Temp 98.2°F | Resp 16 | Wt 116.0 lb

## 2021-01-17 DIAGNOSIS — S39012A Strain of muscle, fascia and tendon of lower back, initial encounter: Secondary | ICD-10-CM | POA: Diagnosis not present

## 2021-01-17 MED ORDER — CYCLOBENZAPRINE HCL 5 MG PO TABS: 5.0000 mg | ORAL_TABLET | Freq: Three times a day (TID) | ORAL | 1 refills | Status: DC | PRN

## 2021-01-17 MED ORDER — PREDNISONE 20 MG PO TABS: 20.0000 mg | ORAL_TABLET | Freq: Every day | ORAL | 1 refills | Status: DC

## 2021-01-17 NOTE — Progress Notes (Signed)
Established patient visit   Patient: Melanie Palmer   DOB: Nov 16, 1950   70 y.o. Female  MRN: [id number] Visit Date: 01/17/2021  Today's healthcare provider: Megan Mans, MD   Chief Complaint  Patient presents with   Hip Pain   Subjective  -------------------------------------------------------------------------------------------------------------------- Hip Pain  The incident occurred more than 1 week ago (1 week). The incident occurred in the yard. There was no injury mechanism. The pain is present in the left leg, left hip and left thigh. The quality of the pain is described as aching. The pain is at a severity of 5/10. The pain is moderate. The pain has been Fluctuating since onset. Pertinent negatives include no inability to bear weight, loss of motion, loss of sensation, muscle weakness, numbness or tingling. She reports no foreign bodies present. The symptoms are aggravated by weight bearing and movement. She has tried acetaminophen, ice and heat for the symptoms. The treatment provided no relief.     Patient has had left hip pain for 1 week. Patient states she has no injury mechanism, however she does work in her yard almost daily. Patient has been having fluctuating pain in left hip that radiates to left thigh and left lower back. Patient has also been having muscle spasms. Patient has been treating symptoms with tylenol, ice and heat with no relief.  He rates the pain as a 4/10 when it is bad and right now.  It does go away.  She is able to sleep and rest at night.    Medications: Outpatient Medications Prior to Visit  Medication Sig   alendronate (FOSAMAX) 70 MG tablet Take 1 tablet (70 mg total) by mouth once a week. Take with a full glass of water on an empty stomach.   clonazePAM (KLONOPIN) 0.5 MG tablet Take 0.25 mg by mouth 2 (two) times daily as needed. Reported on 05/10/2015   fluticasone (FLONASE) 50 MCG/ACT nasal spray SHAKE LIQUID AND USE 2 SPRAYS IN  EACH NOSTRIL DAILY   hydrOXYzine (VISTARIL) 25 MG capsule Take 25 mg by mouth daily.   omeprazole (PRILOSEC) 20 MG capsule TAKE ONE CAPSULE BY MOUTH ONCE DAILY   rosuvastatin (CRESTOR) 5 MG tablet Take 1 tablet (5 mg total) by mouth daily.   sertraline (ZOLOFT) 100 MG tablet Take 100 mg by mouth daily.    traZODone (DESYREL) 50 MG tablet Take 50 mg by mouth at bedtime.   venlafaxine XR (EFFEXOR-XR) 150 MG 24 hr capsule Take 150 mg by mouth daily.   ferrous sulfate 325 (65 FE) MG EC tablet Take 325 mg by mouth daily with breakfast.  (Patient not taking: Reported on 01/17/2021)   [DISCONTINUED] azithromycin (ZITHROMAX) 250 MG tablet 2 po day 1 then 1 po days 2-5 (Patient not taking: No sig reported)   [DISCONTINUED] conjugated estrogens (PREMARIN) vaginal cream Place 1 Applicatorful vaginally daily. (Patient not taking: Reported on 04/25/2020)   [DISCONTINUED] doxycycline (VIBRA-TABS) 100 MG tablet Take 1 tablet (100 mg total) by mouth 2 (two) times daily. 1 po bid (Patient not taking: Reported on 04/25/2020)   [DISCONTINUED] loratadine (CLARITIN) 10 MG tablet Take 10 mg by mouth daily as needed for allergies. (Patient not taking: No sig reported)   [DISCONTINUED] meloxicam (MOBIC) 7.5 MG tablet Take 1 tablet (7.5 mg total) by mouth daily. (Patient not taking: No sig reported)   No facility-administered medications prior to visit.    Review of Systems  Constitutional:  Negative for appetite change, chills, fatigue and fever.  Respiratory:  Negative for chest tightness and shortness of breath.   Cardiovascular:  Negative for chest pain and palpitations.  Gastrointestinal:  Negative for abdominal pain, nausea and vomiting.  Neurological:  Negative for dizziness, tingling, weakness and numbness.       Objective  -------------------------------------------------------------------------------------------------------------------- BP 131/68 (BP Location: Left Arm, Patient Position: Sitting, Cuff Size:  Normal)   Pulse 73   Temp 98.2 F (36.8 C) (Temporal)   Resp 16   Wt 116 lb (52.6 kg)   SpO2 97%   BMI 21.56 kg/m  BP Readings from Last 3 Encounters:  01/17/21 131/68  04/02/20 121/75  03/29/19 117/64   Wt Readings from Last 3 Encounters:  01/17/21 116 lb (52.6 kg)  04/02/20 120 lb (54.4 kg)  03/29/19 121 lb 9.6 oz (55.2 kg)   SpO2 Readings from Last 3 Encounters:  01/17/21 97%  03/24/18 99%  06/11/17 98%      Physical Exam Vitals reviewed.  Constitutional:      Appearance: She is well-developed.  Cardiovascular:     Rate and Rhythm: Normal rate and regular rhythm.     Heart sounds: Normal heart sounds.  Pulmonary:     Effort: Pulmonary effort is normal.     Breath sounds: Normal breath sounds.  Musculoskeletal:        General: Tenderness present. No swelling.     Cervical back: Normal range of motion and neck supple.     Comments: Mild tenderness in the paraspinal muscles and SI joint.  Right leg raise is negative.  Strength is normal in both legs  Neurological:     General: No focal deficit present.     Mental Status: She is alert and oriented to person, place, and time.  Psychiatric:        Mood and Affect: Mood normal.        Behavior: Behavior normal.        Thought Content: Thought content normal.        Judgment: Judgment normal.      No results found for any visits on 01/17/21.  Assessment & Plan  ---------------------------------------------------------------------------------------------------------------------- 1. Strain of lumbar region, initial encounter Think this is all musculoskeletal pains over this time will treat with ice alternating with heat prednisone 20 mg daily for a week and then Flexeril 5 mg every 8 hours as needed. No imaging indicated today.   No follow-ups on file.      I, Megan Mans, MD, have reviewed all documentation for this visit. The documentation on 01/20/21 for the exam, diagnosis, procedures, and orders are  all accurate and complete.    Melanie Garraway Wendelyn Breslow, MD  [facility] [phone] (phone) [phone] (fax)  [facility]

## 2021-01-24 ENCOUNTER — Other Ambulatory Visit: Payer: Self-pay | Admitting: Family Medicine

## 2021-01-24 DIAGNOSIS — K219 Gastro-esophageal reflux disease without esophagitis: Secondary | ICD-10-CM

## 2021-01-24 NOTE — Telephone Encounter (Signed)
Requested medications are due for refill today.  yes  Requested medications are on the active medications list.  yes  Last refill. yes  Future visit scheduled.   yes  Notes to clinic.  Pt has not been seen for Genella Rife in over 1 year.

## 2021-01-25 NOTE — Telephone Encounter (Signed)
LOV (for chronic issues) : 04/02/2020.  Next OV: 04/04/2021.   Last refill: 04/29/2020.   Please advise. Thanks!

## 2021-02-14 ENCOUNTER — Ambulatory Visit: Payer: Medicare HMO | Admitting: Family Medicine

## 2021-02-19 DIAGNOSIS — H401132 Primary open-angle glaucoma, bilateral, moderate stage: Secondary | ICD-10-CM | POA: Diagnosis not present

## 2021-02-19 DIAGNOSIS — Z0100 Encounter for examination of eyes and vision without abnormal findings: Secondary | ICD-10-CM | POA: Diagnosis not present

## 2021-03-06 DIAGNOSIS — H401131 Primary open-angle glaucoma, bilateral, mild stage: Secondary | ICD-10-CM | POA: Diagnosis not present

## 2021-04-04 ENCOUNTER — Other Ambulatory Visit: Payer: Self-pay

## 2021-04-04 ENCOUNTER — Ambulatory Visit (INDEPENDENT_AMBULATORY_CARE_PROVIDER_SITE_OTHER): Payer: Medicare HMO | Admitting: Family Medicine

## 2021-04-04 ENCOUNTER — Encounter: Payer: Self-pay | Admitting: Family Medicine

## 2021-04-04 VITALS — BP 122/54 | HR 75 | Temp 98.0°F | Resp 16 | Ht 61.5 in | Wt 117.0 lb

## 2021-04-04 DIAGNOSIS — Z0000 Encounter for general adult medical examination without abnormal findings: Secondary | ICD-10-CM | POA: Diagnosis not present

## 2021-04-04 DIAGNOSIS — E039 Hypothyroidism, unspecified: Secondary | ICD-10-CM | POA: Diagnosis not present

## 2021-04-04 DIAGNOSIS — E2839 Other primary ovarian failure: Secondary | ICD-10-CM | POA: Diagnosis not present

## 2021-04-04 DIAGNOSIS — F330 Major depressive disorder, recurrent, mild: Secondary | ICD-10-CM

## 2021-04-04 DIAGNOSIS — E7800 Pure hypercholesterolemia, unspecified: Secondary | ICD-10-CM | POA: Diagnosis not present

## 2021-04-04 DIAGNOSIS — J301 Allergic rhinitis due to pollen: Secondary | ICD-10-CM | POA: Diagnosis not present

## 2021-04-04 NOTE — Progress Notes (Signed)
I,Roshena L Chambers,acting as a scribe for Wilhemena Durie, MD.,have documented all relevant documentation on the behalf of Wilhemena Durie, MD,as directed by  Wilhemena Durie, MD while in the presence of Wilhemena Durie, MD.   Complete physical exam   Patient: Melanie Palmer   DOB: [birth year]   70 y.o. Female  MRN: [id number] Visit Date: 04/04/2021  Today's healthcare provider: Wilhemena Durie, MD   Chief Complaint  Patient presents with   Medicare Wellness   Subjective    Kit Sleiman is a 70 y.o. female who presents today for a complete physical exam.  She reports consuming a general diet. Home exercise routine includes yard work. She generally feels fairly well. She reports sleeping fairly well. She does not have additional problems to discuss today.  Patient is married and retired.  Mother of 2 with her son being deceased after an [employer] stent and accidental overdose.  1 granddaughter.  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  Past Medical History:  Diagnosis Date   Anxiety    Depression    GERD (gastroesophageal reflux disease)    Hypercholesteremia    Past Surgical History:  Procedure Laterality Date   BREAST CYST EXCISION Left 1970's   BREAST EXCISIONAL BIOPSY     CESAREAN SECTION     COLONOSCOPY     HEEL SPUR SURGERY     UPPER GI ENDOSCOPY  09/16/12   normal, no H Pylori, no need to repeat of EGD.   Social History   Socioeconomic History   Marital status: Married    Spouse name: Not on file   Number of children: 2   Years of education: Not on file   Highest education level: High school graduate  Occupational History   Not on file  Tobacco Use   Smoking status: Never   Smokeless tobacco: Never  Vaping Use   Vaping Use: Never used  Substance and Sexual Activity   Alcohol use: No   Drug use: No   Sexual activity: Not Currently     Birth control/protection: Post-menopausal  Other Topics Concern   Not on file  Social History Narrative   Not on file   Social Determinants of Health   Financial Resource Strain: Low Risk    Difficulty of Paying Living Expenses: Not hard at all  Food Insecurity: No Food Insecurity   Worried About Charity fundraiser in the Last Year: Never true   [place] in the Last Year: Never true  Transportation Needs: No Transportation Needs   Lack of Transportation (Medical): No   Lack of Transportation (Non-Medical): No  Physical Activity: Sufficiently Active   Days of Exercise per Week: 2 days   Minutes of Exercise per Session: 120 min  Stress: No Stress Concern Present   Feeling of Stress : Not at all  Social Connections: Moderately Integrated   Frequency of Communication with Friends and Family: More than three times a week   Frequency of Social Gatherings with Friends and Family: More than three times a week   Attends Religious Services: More than 4 times per year   Active Member of [employer] or Organizations: No   Attends Archivist Meetings: Never   Marital Status: Married  Human resources officer Violence: Not At Risk   Fear of Current or Ex-Partner: No  Emotionally Abused: No   Physically Abused: No   Sexually Abused: No   Family Status  Relation Name Status   Mother  Deceased at age 64   Father  Deceased at age 32   Sister  Alive       alzheimer   Son  Deceased   Daughter  Alive   Sister  Deceased       septic shock   Sister  Alive   Other Neice Alive   Neg Hx  (Not Specified)   Family History  Problem Relation Age of Onset   Hypertension Mother    Melanoma Mother    Heart disease Father    Congestive Heart Failure Father    Heart disease Sister    Heart disease Son    Breast cancer Other    Colon cancer Neg Hx    Ovarian cancer Neg Hx    Allergies  Allergen Reactions   Sulfa Antibiotics Hives   Penicillins Rash    Patient Care Team: Maple Hudson., MD as PCP - General (Family Medicine) Dingeldein, Viviann Spare, MD as Consulting Physician (Ophthalmology) Darliss Ridgel, MD as Referring Physician (Psychiatry)   Medications: Outpatient Medications Prior to Visit  Medication Sig   alendronate (FOSAMAX) 70 MG tablet Take 1 tablet (70 mg total) by mouth once a week. Take with a full glass of water on an empty stomach.   clonazePAM (KLONOPIN) 0.5 MG tablet Take 0.25 mg by mouth 2 (two) times daily as needed. Reported on 05/10/2015   fluticasone (FLONASE) 50 MCG/ACT nasal spray SHAKE LIQUID AND USE 2 SPRAYS IN EACH NOSTRIL DAILY   hydrOXYzine (VISTARIL) 25 MG capsule Take 25 mg by mouth daily.   omeprazole (PRILOSEC) 20 MG capsule TAKE 1 CAPSULE BY MOUTH EVERY DAY   rosuvastatin (CRESTOR) 5 MG tablet Take 1 tablet (5 mg total) by mouth daily.   sertraline (ZOLOFT) 100 MG tablet Take 100 mg by mouth daily.    traZODone (DESYREL) 50 MG tablet Take 50 mg by mouth at bedtime.   venlafaxine XR (EFFEXOR-XR) 150 MG 24 hr capsule Take 150 mg by mouth daily.   cyclobenzaprine (FLEXERIL) 5 MG tablet Take 1 tablet (5 mg total) by mouth 3 (three) times daily as needed for muscle spasms. (Patient not taking: Reported on 04/04/2021)   ferrous sulfate 325 (65 FE) MG EC tablet Take 325 mg by mouth daily with breakfast.  (Patient not taking: Reported on 01/17/2021)   [DISCONTINUED] predniSONE (DELTASONE) 20 MG tablet Take 1 tablet (20 mg total) by mouth daily with breakfast. (Patient not taking: Reported on 04/04/2021)   No facility-administered medications prior to visit.    Review of Systems  Constitutional:  Negative for chills, fatigue and fever.  HENT:  Negative for congestion, ear pain, rhinorrhea, sneezing and sore throat.   Eyes: Negative.  Negative for pain and redness.  Respiratory:  Negative for cough, shortness of breath and wheezing.   Cardiovascular:  Negative for chest pain and leg swelling.  Gastrointestinal:  Negative for abdominal  pain, blood in stool, constipation, diarrhea and nausea.  Endocrine: Negative for polydipsia and polyphagia.  Genitourinary: Negative.  Negative for dysuria, flank pain, hematuria, pelvic pain, vaginal bleeding and vaginal discharge.  Musculoskeletal:  Negative for arthralgias, back pain, gait problem and joint swelling.  Skin:  Negative for rash.  Neurological: Negative.  Negative for dizziness, tremors, seizures, weakness, light-headedness, numbness and headaches.  Hematological:  Negative for adenopathy.  Psychiatric/Behavioral: Negative.  Negative for  behavioral problems, confusion and dysphoric mood. The patient is not nervous/anxious and is not hyperactive.   All other systems reviewed and are negative.     Objective    BP (!) 122/54 (BP Location: Left Arm, Patient Position: Sitting, Cuff Size: Normal)   Pulse 75   Temp 98 F (36.7 C) (Oral)   Resp 16   Ht 5' 1.5" (1.562 m)   Wt 117 lb (53.1 kg)   SpO2 100% Comment: room air  BMI 21.75 kg/m  BP Readings from Last 3 Encounters:  04/04/21 (!) 122/54  01/17/21 131/68  04/02/20 121/75   Wt Readings from Last 3 Encounters:  04/04/21 117 lb (53.1 kg)  01/17/21 116 lb (52.6 kg)  04/02/20 120 lb (54.4 kg)      Physical Exam Vitals reviewed.  Constitutional:      Appearance: She is well-developed.  HENT:     Head: Normocephalic and atraumatic.     Right Ear: External ear normal.     Left Ear: External ear normal.     Nose: Nose normal.  Eyes:     General: No scleral icterus.    Conjunctiva/sclera: Conjunctivae normal.  Neck:     Thyroid: No thyromegaly.  Cardiovascular:     Rate and Rhythm: Normal rate and regular rhythm.     Heart sounds: Normal heart sounds.  Pulmonary:     Effort: Pulmonary effort is normal.     Breath sounds: Normal breath sounds.  Abdominal:     Palpations: Abdomen is soft.  Musculoskeletal:     Right lower leg: No edema.     Left lower leg: No edema.  Skin:    General: Skin is warm and  dry.  Neurological:     General: No focal deficit present.     Mental Status: She is alert and oriented to person, place, and time.  Psychiatric:        Mood and Affect: Mood normal.        Behavior: Behavior normal.        Thought Content: Thought content normal.        Judgment: Judgment normal.      Last depression screening scores PHQ 2/9 Scores 04/04/2021 04/25/2020 03/23/2019  PHQ - 2 Score 0 0 0  PHQ- 9 Score 0 - -   Last fall risk screening Fall Risk  04/04/2021  Falls in the past year? 0  Number falls in past yr: 0  Injury with Fall? 0  Risk for fall due to : No Fall Risks  Follow up Falls evaluation completed   Last Audit-C alcohol use screening Alcohol Use Disorder Test (AUDIT) 04/04/2021  1. How often do you have a drink containing alcohol? 0  2. How many drinks containing alcohol do you have on a typical day when you are drinking? 0  3. How often do you have six or more drinks on one occasion? 0  AUDIT-C Score 0   A score of 3 or more in women, and 4 or more in men indicates increased risk for alcohol abuse, EXCEPT if all of the points are from question 1   No results found for any visits on 04/04/21.  Assessment & Plan    Routine Health Maintenance and Physical Exam  Exercise Activities and Dietary recommendations  Goals      Increase water intake     Starting 03/14/16, I will increase my water intake to 8 glasses a day.  Immunization History  Administered Date(s) Administered   Fluad Quad(high Dose 65+) 03/15/2019   Influenza Whole 01/28/2016   Influenza, High Dose Seasonal PF 02/09/2015, 02/16/2017, 03/19/2018, 03/07/2021   Influenza-Unspecified 03/02/2020   PFIZER(Purple Top)SARS-COV-2 Vaccination 07/17/2019, 08/10/2019, 04/26/2020   Pneumococcal Conjugate-13 07/06/2014, 02/09/2015   Pneumococcal Polysaccharide-23 03/14/2016   Tdap 10/28/2011   Zoster, Live 10/28/2011    Health Maintenance  Topic Date Due   Zoster Vaccines- Shingrix  (1 of 2) Never done   COVID-19 Vaccine (4 - Booster for Pfizer series) 06/21/2020   TETANUS/TDAP  10/27/2021   MAMMOGRAM  06/12/2022   COLONOSCOPY (Pts 45-64yrs Insurance coverage will need to be confirmed)  02/13/2023   DEXA SCAN  06/12/2025   Pneumonia Vaccine 72+ Years old  Completed   INFLUENZA VACCINE  Completed   Hepatitis C Screening  Completed   HPV VACCINES  Aged Out    Discussed health benefits of physical activity, and encouraged her to engage in regular exercise appropriate for her age and condition.  1. Annual physical exam Up To date with colonoscopy and mammogram. - Lipid panel - TSH - CBC w/Diff/Platelet - Comprehensive Metabolic Panel (CMET)  2. Pure hypercholesterolemia On rosuvastatin 5 - Lipid panel - TSH - CBC w/Diff/Platelet - Comprehensive Metabolic Panel (CMET)  3. Adult hypothyroidism On Synthroid 100 mcg - Lipid panel - TSH - CBC w/Diff/Platelet - Comprehensive Metabolic Panel (CMET)  4. Estrogen deficiency Fosamax for osteoporosis - Lipid panel - TSH - CBC w/Diff/Platelet - Comprehensive Metabolic Panel (CMET)  5. Allergic rhinitis due to pollen, unspecified seasonality  - Lipid panel - TSH - CBC w/Diff/Platelet - Comprehensive Metabolic Panel (CMET)  6. Mild episode of recurrent major depressive disorder (HCC) On sertraline and venlafaxine followed by psychiatry.  Probably on meds  for life. - Lipid panel - TSH - CBC w/Diff/Platelet - Comprehensive Metabolic Panel (CMET)   Return in about 6 months (around 10/02/2021).    I, Wilhemena Durie, MD, have reviewed all documentation for this visit. The documentation on 04/06/21 for the exam, diagnosis, procedures, and orders are all accurate and complete.    Itzel Lowrimore Cranford Mon, MD  [facility] [phone] (phone) [phone] (fax)  [place]

## 2021-04-16 DIAGNOSIS — J301 Allergic rhinitis due to pollen: Secondary | ICD-10-CM | POA: Diagnosis not present

## 2021-04-16 DIAGNOSIS — F330 Major depressive disorder, recurrent, mild: Secondary | ICD-10-CM | POA: Diagnosis not present

## 2021-04-16 DIAGNOSIS — E2839 Other primary ovarian failure: Secondary | ICD-10-CM | POA: Diagnosis not present

## 2021-04-16 DIAGNOSIS — E7800 Pure hypercholesterolemia, unspecified: Secondary | ICD-10-CM | POA: Diagnosis not present

## 2021-04-16 DIAGNOSIS — Z0000 Encounter for general adult medical examination without abnormal findings: Secondary | ICD-10-CM | POA: Diagnosis not present

## 2021-04-16 DIAGNOSIS — E039 Hypothyroidism, unspecified: Secondary | ICD-10-CM | POA: Diagnosis not present

## 2021-04-17 LAB — COMPREHENSIVE METABOLIC PANEL
ALT: 10 IU/L (ref 0–32)
AST: 14 IU/L (ref 0–40)
Albumin/Globulin Ratio: 2 (ref 1.2–2.2)
Albumin: 4.3 g/dL (ref 3.7–4.7)
Alkaline Phosphatase: 57 IU/L (ref 44–121)
BUN/Creatinine Ratio: 14 (ref 12–28)
BUN: 11 mg/dL (ref 8–27)
Bilirubin Total: <0.2 mg/dL (ref 0.0–1.2)
CO2: 26 mmol/L (ref 20–29)
Calcium: 9.4 mg/dL (ref 8.7–10.3)
Chloride: 98 mmol/L (ref 96–106)
Creatinine, Ser: 0.81 mg/dL (ref 0.57–1.00)
Globulin, Total: 2.1 g/dL (ref 1.5–4.5)
Glucose: 123 mg/dL — ABNORMAL HIGH (ref 70–99)
Potassium: 4.7 mmol/L (ref 3.5–5.2)
Sodium: 137 mmol/L (ref 134–144)
Total Protein: 6.4 g/dL (ref 6.0–8.5)
eGFR: 78 mL/min/{1.73_m2} (ref >59)

## 2021-04-17 LAB — CBC WITH DIFFERENTIAL/PLATELET
Basophils Absolute: 0.1 10*3/uL (ref 0.0–0.2)
Basos: 1 %
EOS (ABSOLUTE): 0.1 10*3/uL (ref 0.0–0.4)
Eos: 3 %
Hematocrit: 34.5 % (ref 34.0–46.6)
Hemoglobin: 11.8 g/dL (ref 11.1–15.9)
Immature Grans (Abs): 0 10*3/uL (ref 0.0–0.1)
Immature Granulocytes: 0 %
Lymphocytes Absolute: 1.5 10*3/uL (ref 0.7–3.1)
Lymphs: 34 %
MCH: 32.4 pg (ref 26.6–33.0)
MCHC: 34.2 g/dL (ref 31.5–35.7)
MCV: 95 fL (ref 79–97)
Monocytes Absolute: 0.6 10*3/uL (ref 0.1–0.9)
Monocytes: 13 %
Neutrophils Absolute: 2.1 10*3/uL (ref 1.4–7.0)
Neutrophils: 49 %
Platelets: 220 10*3/uL (ref 150–450)
RBC: 3.64 x10E6/uL — ABNORMAL LOW (ref 3.77–5.28)
RDW: 10.9 % — ABNORMAL LOW (ref 11.7–15.4)
WBC: 4.4 10*3/uL (ref 3.4–10.8)

## 2021-04-17 LAB — LIPID PANEL
Chol/HDL Ratio: 3.4 ratio (ref 0.0–4.4)
Cholesterol, Total: 172 mg/dL (ref 100–199)
HDL: 50 mg/dL (ref >39)
LDL Chol Calc (NIH): 108 mg/dL — ABNORMAL HIGH (ref 0–99)
Triglycerides: 73 mg/dL (ref 0–149)
VLDL Cholesterol Cal: 14 mg/dL (ref 5–40)

## 2021-04-17 LAB — TSH: TSH: 2.79 u[IU]/mL (ref 0.450–4.500)

## 2021-04-23 ENCOUNTER — Other Ambulatory Visit: Payer: Self-pay

## 2021-04-23 ENCOUNTER — Other Ambulatory Visit: Payer: Self-pay | Admitting: Family Medicine

## 2021-04-23 MED ORDER — ROSUVASTATIN CALCIUM 5 MG PO TABS: 5.0000 mg | ORAL_TABLET | Freq: Every day | ORAL | 0 refills | Status: DC

## 2021-04-30 ENCOUNTER — Encounter: Payer: Self-pay | Admitting: Physician Assistant

## 2021-04-30 ENCOUNTER — Ambulatory Visit (INDEPENDENT_AMBULATORY_CARE_PROVIDER_SITE_OTHER): Payer: Medicare HMO | Admitting: Physician Assistant

## 2021-04-30 ENCOUNTER — Other Ambulatory Visit: Payer: Self-pay

## 2021-04-30 VITALS — BP 125/44 | HR 80 | Temp 99.9°F | Ht 61.5 in | Wt 117.5 lb

## 2021-04-30 DIAGNOSIS — J0140 Acute pansinusitis, unspecified: Secondary | ICD-10-CM | POA: Diagnosis not present

## 2021-04-30 MED ORDER — DOXYCYCLINE HYCLATE 100 MG PO TABS
100.0000 mg | ORAL_TABLET | Freq: Two times a day (BID) | ORAL | 0 refills | Status: DC
Start: 2021-04-30 — End: 2021-05-02

## 2021-04-30 NOTE — Progress Notes (Signed)
Established patient visit   Patient: Melanie Palmer   DOB: Aug 13, 1950   70 y.o. Female  MRN: [id number] Visit Date: 04/30/2021  Today's healthcare provider: Alfredia Ferguson, PA-C   Chief Complaint  Patient presents with   Cough   Sore Throat   Ear Pain   Facial Pain   Subjective    HPI  Melanie Palmer is a 70 y/o female who presents today with sinus pressure, pain, sore ears, headache, post nasal drip and cough x 4 days. Denies fevers, myalgias. She has been taking tylenol and alka seltzer sinus.  She has flonase at home but has not used it.  Denies taking a COVID test, but states this doesn't feel like COVID to her. Denies any sick contacts.   Medications: Outpatient Medications Prior to Visit  Medication Sig   alendronate (FOSAMAX) 70 MG tablet Take 1 tablet (70 mg total) by mouth once a week. Take with a full glass of water on an empty stomach.   clonazePAM (KLONOPIN) 0.5 MG tablet Take 0.25 mg by mouth 2 (two) times daily as needed. Reported on 05/10/2015   cyclobenzaprine (FLEXERIL) 5 MG tablet Take 1 tablet (5 mg total) by mouth 3 (three) times daily as needed for muscle spasms.   ferrous sulfate 325 (65 FE) MG EC tablet Take 325 mg by mouth daily with breakfast.   fluticasone (FLONASE) 50 MCG/ACT nasal spray SHAKE LIQUID AND USE 2 SPRAYS IN EACH NOSTRIL DAILY   omeprazole (PRILOSEC) 20 MG capsule TAKE 1 CAPSULE BY MOUTH EVERY DAY   rosuvastatin (CRESTOR) 5 MG tablet Take 1 tablet (5 mg total) by mouth daily.   sertraline (ZOLOFT) 100 MG tablet Take 100 mg by mouth daily.    traZODone (DESYREL) 50 MG tablet Take 50 mg by mouth at bedtime.   venlafaxine XR (EFFEXOR-XR) 150 MG 24 hr capsule Take 150 mg by mouth daily.   [DISCONTINUED] hydrOXYzine (VISTARIL) 25 MG capsule Take 25 mg by mouth daily.   No facility-administered medications prior to visit.    Review of Systems  Constitutional:  Positive for fatigue. Negative for fever.  HENT:  Positive for congestion, ear  pain, postnasal drip, rhinorrhea, sinus pressure and sinus pain.   Respiratory:  Positive for cough. Negative for shortness of breath and wheezing.   Cardiovascular:  Negative for chest pain.      Objective    Blood pressure (!) 125/44, pulse 80, temperature 99.9 F (37.7 C), temperature source Oral, height 5' 1.5" (1.562 m), weight 117 lb 8 oz (53.3 kg), SpO2 100 %.   Physical Exam Constitutional:      Appearance: She is well-developed. She is not ill-appearing.  HENT:     Right Ear: Tympanic membrane normal.     Left Ear: Tympanic membrane normal.     Nose: Congestion and rhinorrhea present.     Mouth/Throat:     Mouth: Mucous membranes are moist.     Pharynx: Posterior oropharyngeal erythema present. No oropharyngeal exudate.  Cardiovascular:     Rate and Rhythm: Normal rate and regular rhythm.  Pulmonary:     Effort: Pulmonary effort is normal.     Breath sounds: Normal breath sounds.  Skin:    General: Skin is warm.  Neurological:     Mental Status: She is alert and oriented to person, place, and time.  Psychiatric:        Mood and Affect: Mood normal.        Behavior: Behavior normal.  No results found for any visits on 04/30/21.  Assessment & Plan     Acute sinusitis Advised viral vs bacterial etiology. Typically sinus infections begin as viral and the body is able to clear--pt states her sinus infections never go away on their own. Advised steam inhalations, saline nasal sprays, switching to dayquil/nyquil if she feels the alka setlzer isnt helping Advised she start using her flonase BID. Rx Doxycycline 100 mg BID x 7 days, if her symptoms don't improve. I also advised she take a COVID test.    Return if symptoms worsen or fail to improve.      I, Alfredia Ferguson, PA-C have reviewed all documentation for this visit. The documentation on  04/30/2021 for the exam, diagnosis, procedures, and orders are all accurate and complete.    Alfredia Ferguson, PA-C   [facility] [phone] (phone) [phone] (fax)  [facility]

## 2021-05-02 ENCOUNTER — Other Ambulatory Visit: Payer: Self-pay | Admitting: Physician Assistant

## 2021-05-02 ENCOUNTER — Ambulatory Visit: Payer: Self-pay

## 2021-05-02 DIAGNOSIS — J0140 Acute pansinusitis, unspecified: Secondary | ICD-10-CM

## 2021-05-02 MED ORDER — DOXYCYCLINE HYCLATE 100 MG PO TABS: 100.0000 mg | ORAL_TABLET | Freq: Two times a day (BID) | ORAL | 0 refills | Status: AC

## 2021-05-02 NOTE — Telephone Encounter (Signed)
Pt called, left VM to call back and speak to a nurse regarding abx

## 2021-05-02 NOTE — Telephone Encounter (Signed)
Spoke with pt. States she was given 7 tabs. Took 1 tab Tuesday, has two left; one for tonight, only one for tomorrow. States still feels pressure, dry cough, "Just lingering."

## 2021-05-02 NOTE — Telephone Encounter (Signed)
°  Chief Complaint: congestion Symptoms: cough, congestion, sweating, earache, sore throat Frequency: 4 days Pertinent Negatives: Patient denies fever Disposition: [] ED /[] Urgent Care (no appt availability in office) / [] Appointment(In office/virtual)/ []   Virtual Care/ [] Home Care/ [] Refused Recommended Disposition  Additional Notes: Pt calling in stating that she only has abx left for tomorrow and she feels just as bad as when she came in for visit on 04/30/21. Pt states she is unable to sleep at night d/t coughing and sweating. Pt says she has had sinus infections enough in her lifetime she thinks she needs more abx or another one d/t not feeling any better. Pt didn't want to come back in to be re-seen but wants help so she can feel better before holidays. Advised her I will send message to and they can call her with new recommendations. Pt verbalized understanding.     Reason for Disposition  [1] Sinus congestion as part of a cold AND [2] present < 10 days  Answer Assessment - Initial Assessment Questions 1. LOCATION: "Where does it hurt?"      Head around eyes and nose 2. ONSET: "When did the sinus pain start?"  (e.g., hours, days)      Monday 04/29/21 3. SEVERITY: "How bad is the pain?"   (Scale 1-10; mild, moderate or severe)   - MILD (1-3): doesn't interfere with normal activities    - MODERATE (4-7): interferes with normal activities (e.g., work or school) or awakens from sleep   - SEVERE (8-10): excruciating pain and patient unable to do any normal activities        Moderate, unable to sleep at night  4. RECURRENT SYMPTOM: "Have you ever had sinus problems before?" If Yes, ask: "When was the last time?" and "What happened that time?"      Yes every year spring and fall 5. NASAL CONGESTION: "Is the nose blocked?" If Yes, ask: "Can you open it or must you breathe through your mouth?"     yes 6. NASAL DISCHARGE: "Do you have discharge from your nose?" If so ask,  "What color?"     NA 7. FEVER: "Do you have a fever?" If Yes, ask: "What is it, how was it measured, and when did it start?"      no 8. OTHER SYMPTOMS: "Do you have any other symptoms?" (e.g., sore throat, cough, earache, difficulty breathing)     Cough, sore throat, earaches, sweating  Protocols used: Sinus Pain or Congestion-A-AH

## 2021-05-02 NOTE — Telephone Encounter (Signed)
Lmtcb okay for [facility] nurse triage to advise patient of providers message below. KW °

## 2021-05-03 ENCOUNTER — Ambulatory Visit: Payer: Self-pay

## 2021-05-03 NOTE — Telephone Encounter (Signed)
Chief Complaint: COVID positive Symptoms: cough, congestion, sweating, earache, sore throat Frequency: 5 days Pertinent Negatives: NA Disposition: [] ED /[] Urgent Care (no appt availability in office) / [] Appointment(In office/virtual)/ []  [place] Virtual Care/ [x] Home Care/ [] Refused Recommended Disposition  Additional Notes: Pt states she took home covid test today and was positive as well as husband. She states she feels better some today with taking abx. Advised her to call back Monday if symptoms not improving for him or her to schedule telephone visit or virtual. Advised pt could do virtual UC visit but she says shes not familiar with all that. Care advice given and pt verbalized understanding. No other questions/concerns noted.     Reason for Disposition  [1] COVID-19 diagnosed by positive lab test (e.g., PCR, rapid self-test kit) AND [2] mild symptoms (e.g., cough, fever, others) AND [0] no complications or SOB  Answer Assessment - Initial Assessment Questions 1. COVID-19 DIAGNOSIS: "Who made your COVID-19 diagnosis?" "Was it confirmed by a positive lab test or self-test?" If not diagnosed by a doctor (or NP/PA), ask "Are there lots of cases (community spread) where you live?" Note: See public health department website, if unsure.     Home test  3. ONSET: "When did the COVID-19 symptoms start?"      04/29/21 7. RESPIRATORY STATUS: "Describe your breathing?" (e.g., shortness of breath, wheezing, unable to speak)      No 8. BETTER-SAME-WORSE: "Are you getting better, staying the same or getting worse compared to yesterday?"  If getting worse, ask, "In what way?"     better 10. VACCINE: "Have you had the COVID-19 vaccine?" If Yes, ask: "Which one, how many shots, when did you get it?"       yes 11. BOOSTER: "Have you received your COVID-19 booster?" If Yes, ask: "Which one and when did you get it?"       yes 13. OTHER SYMPTOMS: "Do you have any other symptoms?"  (e.g., chills,  fatigue, headache, loss of smell or taste, muscle pain, sore throat)  Protocols used: Coronavirus (COVID-19) Diagnosed or Suspected-A-AH

## 2021-05-07 NOTE — Telephone Encounter (Signed)
Patient is calling she states she is improving- symptoms: fatigue-getting some better, 1 day left of antibiotic, patient has questions about COVID protocols and the holiday. Patient advised per CDC guidelines for preventing spread and isolation. Patient thankful for information.

## 2021-05-08 ENCOUNTER — Ambulatory Visit: Payer: Self-pay | Admitting: *Deleted

## 2021-05-08 NOTE — Telephone Encounter (Signed)
Reason for Disposition  Patient sounds very sick or weak to the triager    Having pain in throat every time she swallows after taking a Tums last night.  Answer Assessment - Initial Assessment Questions 1. LOCATION: "Where does it hurt?"       Pt calling in.    Last night I took some Tums and since then I thought one went down wrong or was stuck.   I ate bread and drank but it's still there.   The pain started after that.   All night long I wake up and if I swallow it hurts and take a deep breath it hurts.    If I swallow something to drink it hurts.    I just took my last antibiotic and my morning pills I could feel them going down.    As it goes down it's like I bothered something but it does hurt.    I've never had this happen before.   My voice is nasal sounding.  I do have nasal congestion but it's not new. 2. RADIATION: "Does the pain go anywhere else?" (e.g., into neck, jaw, arms, back)     No  3. ONSET: "When did the chest pain begin?" (Minutes, hours or days)      *No Answer* 4. PATTERN "Does the pain come and go, or has it been constant since it started?"  "Does it get worse with exertion?"      *No Answer* 5. DURATION: "How long does it last" (e.g., seconds, minutes, hours)     *No Answer* 6. SEVERITY: "How bad is the pain?"  (e.g., Scale 1-10; mild, moderate, or severe)    - MILD (1-3): doesn't interfere with normal activities     - MODERATE (4-7): interferes with normal activities or awakens from sleep    - SEVERE (8-10): excruciating pain, unable to do any normal activities       *No Answer* 7. CARDIAC RISK FACTORS: "Do you have any history of heart problems or risk factors for heart disease?" (e.g., angina, prior heart attack; diabetes, high blood pressure, high cholesterol, smoker, or strong family history of heart disease)     *No Answer* 8. PULMONARY RISK FACTORS: "Do you have any history of lung disease?"  (e.g., blood clots in lung, asthma, emphysema, birth control pills)      *No Answer* 9. CAUSE: "What do you think is causing the chest pain?"     *No Answer* 10. OTHER SYMPTOMS: "Do you have any other symptoms?" (e.g., dizziness, nausea, vomiting, sweating, fever, difficulty breathing, cough)       *No Answer* 11. PREGNANCY: "Is there any chance you are pregnant?" "When was your last menstrual period?"       *No Answer*  Protocols used: Chest Pain-A-AH  Chief Complaint: pain with swallowing Symptoms: after taking a Tums last night she is having pain with swallowing Frequency: every time she swallows Pertinent Negatives: Patient denies shortness of breath Disposition: [] ED /[x] Urgent Care (no appt availability in office) / [] Appointment(In office/virtual)/ []  [place] [facility]/ [] Home Care/ [] Refused Recommended Disposition  Additional Notes: I have referred her to the [facility] urgent care in [place].   [facility] did not have any openings this week.    Pt was agreeable to this plan however she did mention she may see how she does tonight and if she is still having pain in the morning go on to the urgent care.    I sent her notes  to [facility].

## 2021-06-10 DIAGNOSIS — H401131 Primary open-angle glaucoma, bilateral, mild stage: Secondary | ICD-10-CM | POA: Diagnosis not present

## 2021-06-19 ENCOUNTER — Other Ambulatory Visit: Payer: Self-pay | Admitting: Family Medicine

## 2021-06-19 DIAGNOSIS — Z1231 Encounter for screening mammogram for malignant neoplasm of breast: Secondary | ICD-10-CM

## 2021-06-24 DIAGNOSIS — F331 Major depressive disorder, recurrent, moderate: Secondary | ICD-10-CM | POA: Diagnosis not present

## 2021-06-24 DIAGNOSIS — F411 Generalized anxiety disorder: Secondary | ICD-10-CM | POA: Diagnosis not present

## 2021-06-24 DIAGNOSIS — F410 Panic disorder [episodic paroxysmal anxiety] without agoraphobia: Secondary | ICD-10-CM | POA: Diagnosis not present

## 2021-07-25 ENCOUNTER — Ambulatory Visit
Admission: RE | Admit: 2021-07-25 | Discharge: 2021-07-25 | Disposition: A | Discharge status: Home / Self Care | Payer: Medicare HMO | Source: Ambulatory Visit | Attending: Family Medicine | Admitting: Family Medicine

## 2021-07-25 ENCOUNTER — Other Ambulatory Visit: Payer: Self-pay

## 2021-07-25 DIAGNOSIS — Z1231 Encounter for screening mammogram for malignant neoplasm of breast: Secondary | ICD-10-CM | POA: Diagnosis not present

## 2021-08-09 ENCOUNTER — Other Ambulatory Visit: Payer: Self-pay | Admitting: Family Medicine

## 2021-08-09 DIAGNOSIS — K219 Gastro-esophageal reflux disease without esophagitis: Secondary | ICD-10-CM

## 2021-08-26 ENCOUNTER — Other Ambulatory Visit: Payer: Self-pay | Admitting: Family Medicine

## 2021-10-02 NOTE — Progress Notes (Signed)
Established patient visit  I,Melanie Palmer,acting as a scribe for Megan Mans, MD.,have documented all relevant documentation on the behalf of Megan Mans, MD,as directed by  Megan Mans, MD while in the presence of Megan Mans, MD.   Patient: Melanie Palmer   DOB: 09/29/1950   71 y.o. Female  MRN: [id number] Visit Date: 10/03/2021  Today's healthcare provider: Megan Mans, MD   Chief Complaint  Patient presents with   Follow-up   Hyperlipidemia   Hypothyroidism   Subjective    HPI  Patient comes in today for follow-up.  She has been feeling well but got into some poison ivy a couple of days ago and it is now spreading on her right forearm and the left side of her face. Other than that she feels well. She continues to see psychiatry once or twice a year. Hypothyroid, follow-up  Lab Results  Component Value Date   TSH 2.790 04/16/2021   TSH 4.030 04/02/2020   TSH 2.360 03/29/2019    Wt Readings from Last 3 Encounters:  10/03/21 123 lb (55.8 kg)  04/30/21 117 lb 8 oz (53.3 kg)  04/04/21 117 lb (53.1 kg)    She was last seen for hypothyroid 6 months ago.  Management since that visit includes continue current medications.  -----------------------------------------------------------------------------------------  Lipid/Cholesterol, Follow-up  Last lipid panel Other pertinent labs  Lab Results  Component Value Date   CHOL 172 04/16/2021   HDL 50 04/16/2021   LDLCALC 108 (H) 04/16/2021   TRIG 73 04/16/2021   CHOLHDL 3.4 04/16/2021   Lab Results  Component Value Date   ALT 10 04/16/2021   AST 14 04/16/2021   PLT 220 04/16/2021   TSH 2.790 04/16/2021     She was last seen for this 6 months ago.  Management since that visit includes continue current medications.  The 10-year ASCVD risk score (Arnett DK, et al., 2019) is:  10.8%  ---------------------------------------------------------------------------------------------------  Depression: patient is followed by psychiatry.On Sertraline and Venlafaxine.  Medications: Outpatient Medications Prior to Visit  Medication Sig   alendronate (FOSAMAX) 70 MG tablet TAKE 1 TABLET(70 MG) BY MOUTH 1 TIME A WEEK WITH A FULL GLASS OF WATER AND ON AN EMPTY STOMACH   clonazePAM (KLONOPIN) 0.5 MG tablet Take 0.25 mg by mouth 2 (two) times daily as needed. Reported on 05/10/2015   cyclobenzaprine (FLEXERIL) 5 MG tablet Take 1 tablet (5 mg total) by mouth 3 (three) times daily as needed for muscle spasms.   ferrous sulfate 325 (65 FE) MG EC tablet Take 325 mg by mouth daily with breakfast.   fluticasone (FLONASE) 50 MCG/ACT nasal spray SHAKE LIQUID AND USE 2 SPRAYS IN EACH NOSTRIL DAILY   latanoprost (XALATAN) 0.005 % ophthalmic solution 1 drop at bedtime.   omeprazole (PRILOSEC) 20 MG capsule TAKE 1 CAPSULE BY MOUTH EVERY DAY   sertraline (ZOLOFT) 100 MG tablet Take 100 mg by mouth daily.    traZODone (DESYREL) 50 MG tablet Take 50 mg by mouth at bedtime.   venlafaxine XR (EFFEXOR-XR) 150 MG 24 hr capsule Take 150 mg by mouth daily.   rosuvastatin (CRESTOR) 5 MG tablet Take 1 tablet (5 mg total) by mouth daily.   No facility-administered medications prior to visit.    Review of Systems  Constitutional:  Negative for appetite change, chills, fatigue and fever.  Respiratory:  Negative for chest tightness and shortness of breath.   Cardiovascular:  Negative for chest pain and palpitations.  Gastrointestinal:  Negative for abdominal pain, nausea and vomiting.  Neurological:  Negative for dizziness and weakness.   Last lipids Lab Results  Component Value Date   CHOL 172 04/16/2021   HDL 50 04/16/2021   LDLCALC 108 (H) 04/16/2021   TRIG 73 04/16/2021   CHOLHDL 3.4 04/16/2021       Objective    BP (!) 124/48 (BP Location: Left Arm, Patient Position: Sitting, Cuff  Size: Normal)   Pulse 69   Resp 16   Wt 123 lb (55.8 kg)   SpO2 100%   BMI 22.86 kg/m  BP Readings from Last 3 Encounters:  10/03/21 (!) 124/48  04/30/21 (!) 125/44  04/04/21 (!) 122/54   Wt Readings from Last 3 Encounters:  10/03/21 123 lb (55.8 kg)  04/30/21 117 lb 8 oz (53.3 kg)  04/04/21 117 lb (53.1 kg)      Physical Exam Vitals reviewed.  Constitutional:      General: She is not in acute distress.    Appearance: She is well-developed.  HENT:     Head: Normocephalic and atraumatic.     Right Ear: Hearing normal.     Left Ear: Hearing normal.     Nose: Nose normal.  Eyes:     General: Lids are normal. No scleral icterus.       Right eye: No discharge.        Left eye: No discharge.     Conjunctiva/sclera: Conjunctivae normal.  Cardiovascular:     Rate and Rhythm: Normal rate and regular rhythm.     Heart sounds: Normal heart sounds.  Pulmonary:     Effort: Pulmonary effort is normal. No respiratory distress.  Skin:    General: Skin is warm.     Findings: No lesion or rash.     Comments: Mild Rous dermatitis of the right forearm and left lower face  Neurological:     General: No focal deficit present.     Mental Status: She is alert and oriented to person, place, and time.  Psychiatric:        Mood and Affect: Mood normal.        Speech: Speech normal.        Behavior: Behavior normal.        Thought Content: Thought content normal.        Judgment: Judgment normal.      No results found for any visits on 10/03/21.  Assessment & Plan      1. Hypertension, unspecified type Good control.  2. Rhus dermatitis  - predniSONE (DELTASONE) 10 MG tablet; 12 day Taper  Dispense: 42 tablet; Refill: 0 - methylPREDNISolone acetate (DEPO-MEDROL) injection 40 mg - methylPREDNISolone acetate (DEPO-MEDROL) injection 40 mg   Return in about 6 months (around 04/05/2022).      I, Megan Mans, MD, have reviewed all documentation for this visit. The  documentation on 10/06/21 for the exam, diagnosis, procedures, and orders are all accurate and complete.    Belvie Iribe Wendelyn Breslow, MD  [facility] [phone] (phone) [phone] (fax)  [facility]

## 2021-10-03 ENCOUNTER — Ambulatory Visit (INDEPENDENT_AMBULATORY_CARE_PROVIDER_SITE_OTHER): Payer: Medicare HMO | Admitting: Family Medicine

## 2021-10-03 ENCOUNTER — Encounter: Payer: Self-pay | Admitting: Family Medicine

## 2021-10-03 VITALS — BP 124/48 | HR 69 | Resp 16 | Wt 123.0 lb

## 2021-10-03 DIAGNOSIS — L255 Unspecified contact dermatitis due to plants, except food: Secondary | ICD-10-CM

## 2021-10-03 DIAGNOSIS — I10 Essential (primary) hypertension: Secondary | ICD-10-CM | POA: Diagnosis not present

## 2021-10-03 MED ORDER — METHYLPREDNISOLONE ACETATE 40 MG/ML IJ SUSP
40.0000 mg | Freq: Once | INTRAMUSCULAR | Status: AC
  Administered 2021-10-03: 40 mg via INTRAMUSCULAR

## 2021-10-03 MED ORDER — PREDNISONE 10 MG PO TABS: ORAL_TABLET | ORAL | 0 refills | Status: DC

## 2021-10-16 ENCOUNTER — Ambulatory Visit: Payer: Self-pay

## 2021-10-16 NOTE — Telephone Encounter (Signed)
Chief Complaint: Worsening Poison Ivy Rash Symptoms: Rash has spread over abdomen, under breasts, around hips, some blistered, red, raised, severe itching Frequency: Onset around 3 weeks ago, finished prednisone a few days ago and the rash is not better, new areas have come up, severe itching, blisters Pertinent Negatives: Patient denies other symptoms Disposition: [] ED /[] Urgent Care (no appt availability in office) / [x] Appointment(In office/virtual)/ []  [facility]/ [] Home Care/ [] Refused Recommended Disposition /[] [place] [facility]/ []  Follow-up with PCP Additional Notes: Advised due to the new breakouts and the one's not better after prednisone, she will need to be re-evaluated, she verbalized understanding, agrees to see any provider due to no openings with PCP until Monday 10/21/21. Scheduled for tomorrow with , FNP.     Summary: advice - posion ivy   Pt stated she previously had posion ivy / oak and doesn't think the predniSONE (DELTASONE) 10 MG tablet is working, pt needed advice on what to do since she is still itchy.      Reason for Disposition  Rash lasts > 3 weeks  Answer Assessment - Initial Assessment Questions 1. APPEARANCE of RASH: "Describe the rash."      Some are red, raised, some blistered 2. LOCATION: "Where is the rash located?"  (e.g., face, genitals, hands, legs)     Inside and outside arms, all over stomach, neck 3. SIZE: "How large is the rash?"      Goes around stomach up to breast 4. ONSET: "When did the rash begin?"      2 weeks ago 5. ITCHING: "Does the rash itch?" If Yes, ask: "How bad is it?"   - MILD - doesn't interfere with normal activities   - MODERATE-SEVERE: interferes with work, school, sleep, or other activities      Awful 6. EXPOSURE:  "How were you exposed to the plant (poison ivy, poison oak, sumac)"  "When were you exposed?"       Yes, less than 3 weeks ago 7. PAST HISTORY: "Have you had a poison ivy rash before?"  If Yes, ask: "How bad was it?"     Yes, not sure if it's been this bad, but it may have been 8. PREGNANCY: "Is there any chance you are pregnant?" "When was your last menstrual period?"     N/A  Protocols used: Poison Ivy - Oak - [facility]

## 2021-10-17 ENCOUNTER — Ambulatory Visit (INDEPENDENT_AMBULATORY_CARE_PROVIDER_SITE_OTHER): Payer: Medicare HMO | Admitting: Family Medicine

## 2021-10-17 ENCOUNTER — Other Ambulatory Visit: Payer: Self-pay | Admitting: Family Medicine

## 2021-10-17 VITALS — BP 116/73 | HR 81 | Temp 97.9°F | Resp 16 | Ht 61.0 in | Wt 123.2 lb

## 2021-10-17 DIAGNOSIS — L255 Unspecified contact dermatitis due to plants, except food: Secondary | ICD-10-CM | POA: Insufficient documentation

## 2021-10-17 MED ORDER — HYDROXYZINE HCL 10 MG PO TABS: 10.0000 mg | ORAL_TABLET | Freq: Three times a day (TID) | ORAL | 1 refills | Status: DC | PRN

## 2021-10-17 MED ORDER — PREDNISONE 10 MG PO TABS: ORAL_TABLET | ORAL | 0 refills | Status: DC

## 2021-10-17 MED ORDER — TRIAMCINOLONE ACETONIDE 0.1 % EX CREA: 1.0000 "application " | TOPICAL_CREAM | Freq: Two times a day (BID) | CUTANEOUS | 0 refills | Status: DC

## 2021-10-17 MED ORDER — BETAMETHASONE DIPROPIONATE 0.05 % EX CREA: TOPICAL_CREAM | Freq: Two times a day (BID) | CUTANEOUS | 3 refills | Status: DC

## 2021-10-17 MED ORDER — FAMOTIDINE 40 MG PO TABS
40.0000 mg | ORAL_TABLET | Freq: Every day | ORAL | 1 refills | Status: DC
Start: 2021-10-17 — End: 2021-11-18

## 2021-10-17 NOTE — Assessment & Plan Note (Addendum)
Acute, ongoing, denies systemic symptoms Was treated with 2 injections of steroids in office Was treated with 12 day oral taper of prednisone Used OTC medication to assist with topical rash/itching Has spread to abdomen and rash continues on bilateral arms and on face No crusted of lesions apparent Patient request referral to derm to current concern and to establish care for TBSE and other skin concerns Recommend repeat with oral steroids; will use both topical creams for face- lower potency and high potency for trunk and limbs, recommend dual anti-histamine therapy for itch relief RTC in 3 weeks if symptoms fail to improve Seek emergent care if necessary

## 2021-10-17 NOTE — Progress Notes (Signed)
Established patient visit   Patient: Melanie Palmer   DOB: 1951-01-30   71 y.o. Female  MRN: [id number] Visit Date: 10/17/2021  Today's healthcare provider: Jacky Kindle, FNP  Patient presents for new patient visit to establish care.  Introduced to Publishing rights manager role and practice setting.  All questions answered.  Discussed provider/patient relationship and expectations.   I,Tiffany J Bragg,acting as a scribe for Jacky Kindle, FNP.,have documented all relevant documentation on the behalf of Jacky Kindle, FNP,as directed by  Jacky Kindle, FNP while in the presence of Jacky Kindle, FNP.   Chief Complaint  Patient presents with   Rash    Patient complains of poison sumac rash all over body for 2 weeks ago that is spreading.    Subjective    HPI HPI     Rash    Additional comments: Patient complains of poison sumac rash all over body for 2 weeks ago that is spreading.       Last edited by Marlana Salvage, CMA on 10/17/2021 10:57 AM.       Medications: Outpatient Medications Prior to Visit  Medication Sig   alendronate (FOSAMAX) 70 MG tablet TAKE 1 TABLET(70 MG) BY MOUTH 1 TIME A WEEK WITH A FULL GLASS OF WATER AND ON AN EMPTY STOMACH   clonazePAM (KLONOPIN) 0.5 MG tablet Take 0.25 mg by mouth 2 (two) times daily as needed. Reported on 05/10/2015   cyclobenzaprine (FLEXERIL) 5 MG tablet Take 1 tablet (5 mg total) by mouth 3 (three) times daily as needed for muscle spasms.   ferrous sulfate 325 (65 FE) MG EC tablet Take 325 mg by mouth daily with breakfast.   fluticasone (FLONASE) 50 MCG/ACT nasal spray SHAKE LIQUID AND USE 2 SPRAYS IN EACH NOSTRIL DAILY   latanoprost (XALATAN) 0.005 % ophthalmic solution 1 drop at bedtime.   omeprazole (PRILOSEC) 20 MG capsule TAKE 1 CAPSULE BY MOUTH EVERY DAY   sertraline (ZOLOFT) 100 MG tablet Take 100 mg by mouth daily.    traZODone (DESYREL) 50 MG tablet Take 50 mg by mouth at bedtime.   venlafaxine XR (EFFEXOR-XR) 150 MG  24 hr capsule Take 150 mg by mouth daily.   rosuvastatin (CRESTOR) 5 MG tablet Take 1 tablet (5 mg total) by mouth daily.   [DISCONTINUED] predniSONE (DELTASONE) 10 MG tablet 12 day Taper (Patient not taking: Reported on 10/17/2021)   No facility-administered medications prior to visit.    Review of Systems     Objective    BP 116/73 (BP Location: Right Arm, Patient Position: Sitting, Cuff Size: Normal)   Pulse 81   Temp 97.9 F (36.6 C) (Oral)   Resp 16   Ht 5\' 1"  (1.549 m)   Wt 123 lb 3.2 oz (55.9 kg)   SpO2 98%   BMI 23.28 kg/m    Physical Exam Constitutional:      Appearance: She is normal weight.  HENT:     Head: Normocephalic.  Cardiovascular:     Rate and Rhythm: Normal rate.     Pulses: Normal pulses.  Pulmonary:     Effort: Pulmonary effort is normal.  Musculoskeletal:        General: Normal range of motion.     Cervical back: Normal range of motion.  Skin:    General: Skin is warm and dry.     Findings: Erythema and rash present.       Neurological:     General: No  focal deficit present.     Mental Status: She is alert and oriented to person, place, and time.  Psychiatric:        Mood and Affect: Mood normal.        Behavior: Behavior normal.        Thought Content: Thought content normal.        Judgment: Judgment normal.     No results found for any visits on 10/17/21.  Assessment & Plan     Problem List Items Addressed This Visit       Musculoskeletal and Integument   Dermatitis due to plants, including poison ivy, sumac, and oak - Primary    Acute, ongoing, denies systemic symptoms Was treated with 2 injections of steroids in office Was treated with 12 day oral taper of prednisone Used OTC medication to assist with topical rash/itching Has spread to abdomen and rash continues on bilateral arms and on face No crusted of lesions apparent Patient request referral to derm to current concern and to establish care for TBSE and other skin  concerns Recommend repeat with oral steroids; will use both topical creams for face- lower potency and high potency for trunk and limbs, recommend dual anti-histamine therapy for itch relief RTC in 3 weeks if symptoms fail to improve Seek emergent care if necessary        Relevant Medications   predniSONE (DELTASONE) 10 MG tablet   betamethasone dipropionate 0.05 % cream   triamcinolone cream (KENALOG) 0.1 %   hydrOXYzine (ATARAX) 10 MG tablet   famotidine (PEPCID) 40 MG tablet   Other Relevant Orders   Ambulatory referral to Dermatology    Return in about 3 weeks (around 11/07/2021), or if symptoms worsen or fail to improve.      Leilani Merl, FNP, have reviewed all documentation for this visit. The documentation on 10/17/21 for the exam, diagnosis, procedures, and orders are all accurate and complete.    Jacky Kindle, FNP  [facility] [phone] (phone) [phone] (fax)  [facility]

## 2021-10-21 DIAGNOSIS — F411 Generalized anxiety disorder: Secondary | ICD-10-CM | POA: Diagnosis not present

## 2021-10-21 DIAGNOSIS — F410 Panic disorder [episodic paroxysmal anxiety] without agoraphobia: Secondary | ICD-10-CM | POA: Diagnosis not present

## 2021-10-21 DIAGNOSIS — F331 Major depressive disorder, recurrent, moderate: Secondary | ICD-10-CM | POA: Diagnosis not present

## 2021-11-03 ENCOUNTER — Other Ambulatory Visit: Payer: Self-pay | Admitting: Family Medicine

## 2021-11-14 DIAGNOSIS — L218 Other seborrheic dermatitis: Secondary | ICD-10-CM | POA: Diagnosis not present

## 2021-11-17 ENCOUNTER — Other Ambulatory Visit: Payer: Self-pay | Admitting: Family Medicine

## 2021-11-17 DIAGNOSIS — L255 Unspecified contact dermatitis due to plants, except food: Secondary | ICD-10-CM

## 2021-11-22 DIAGNOSIS — H401131 Primary open-angle glaucoma, bilateral, mild stage: Secondary | ICD-10-CM | POA: Diagnosis not present

## 2021-11-22 DIAGNOSIS — H2513 Age-related nuclear cataract, bilateral: Secondary | ICD-10-CM | POA: Diagnosis not present

## 2021-12-17 ENCOUNTER — Other Ambulatory Visit: Payer: Self-pay | Admitting: Family Medicine

## 2021-12-17 DIAGNOSIS — L255 Unspecified contact dermatitis due to plants, except food: Secondary | ICD-10-CM

## 2021-12-18 NOTE — Telephone Encounter (Signed)
Requested Prescriptions  Pending Prescriptions Disp Refills  . hydrOXYzine (ATARAX) 10 MG tablet [Pharmacy Med Name: HYDROXYZINE HCL 10MG  TABLETS] 90 tablet 1    Sig: TAKE 1 TABLET(10 MG) BY MOUTH THREE TIMES DAILY AS NEEDED     Ear, Nose, and Throat:  Antihistamines 2 Passed - 12/17/2021  6:36 AM      Passed - Cr in normal range and within 360 days    Creat  Date Value Ref Range Status  03/18/2017 0.75 0.50 - 0.99 mg/dL Final    Comment:    For patients >4 years of age, the reference limit for Creatinine is approximately 13% higher for people identified as African-American. .    Creatinine, Ser  Date Value Ref Range Status  04/16/2021 0.81 0.57 - 1.00 mg/dL Final         Passed - Valid encounter within last 12 months    Recent Outpatient Visits          2 months ago Dermatitis due to plants, including poison ivy, sumac, and oak   [facility] T, FNP   2 months ago Hypertension, unspecified type   [facility] MD   7 months ago Acute non-recurrent pansinusitis   [facility], PA-C   8 months ago Annual physical exam   [facility] MD   11 months ago Strain of lumbar region, initial encounter   [facility]., MD

## 2021-12-25 DIAGNOSIS — H401131 Primary open-angle glaucoma, bilateral, mild stage: Secondary | ICD-10-CM | POA: Diagnosis not present

## 2021-12-29 IMAGING — MG MM DIGITAL SCREENING BILAT W/ TOMO AND CAD
8 series · 9 of 24 positions shown · non-contrast
Comparison: Previous exam(s).

CLINICAL DATA: Screening.

EXAM:
DIGITAL SCREENING BILATERAL MAMMOGRAM WITH TOMO AND CAD

[L MLO synth-2D]
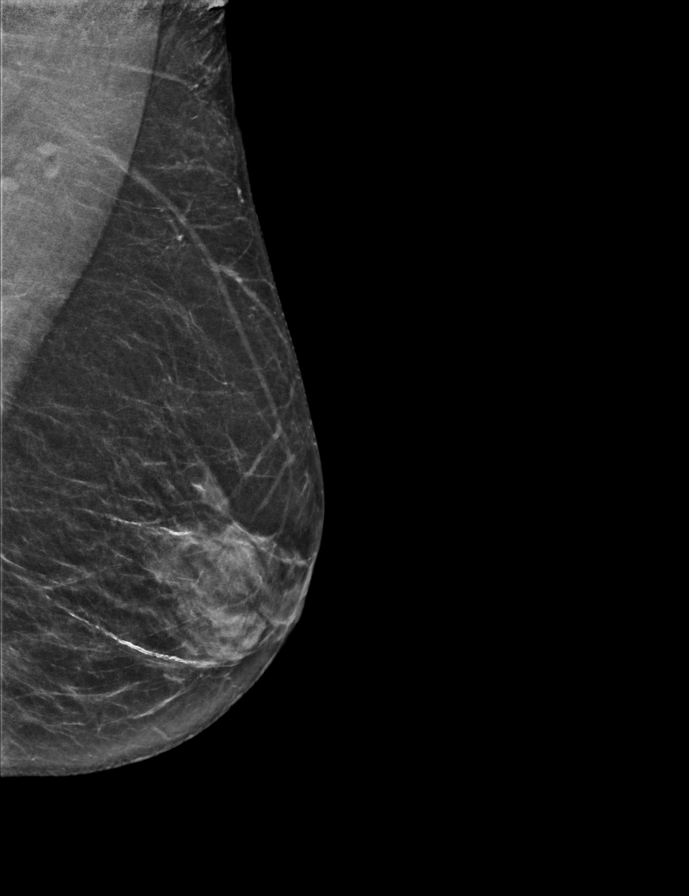

[R MLO synth-2D]
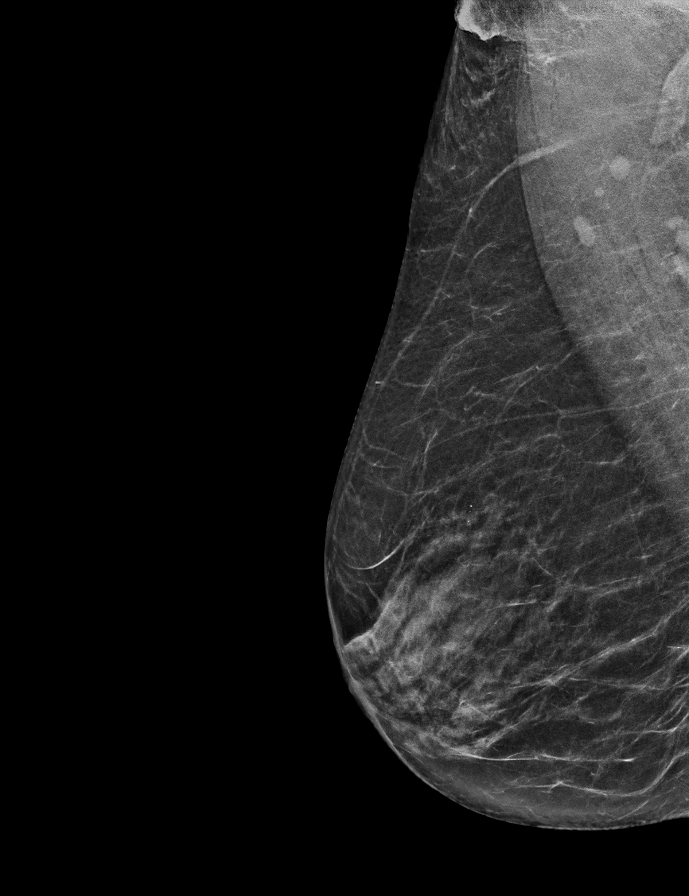

[R CC synth-2D]
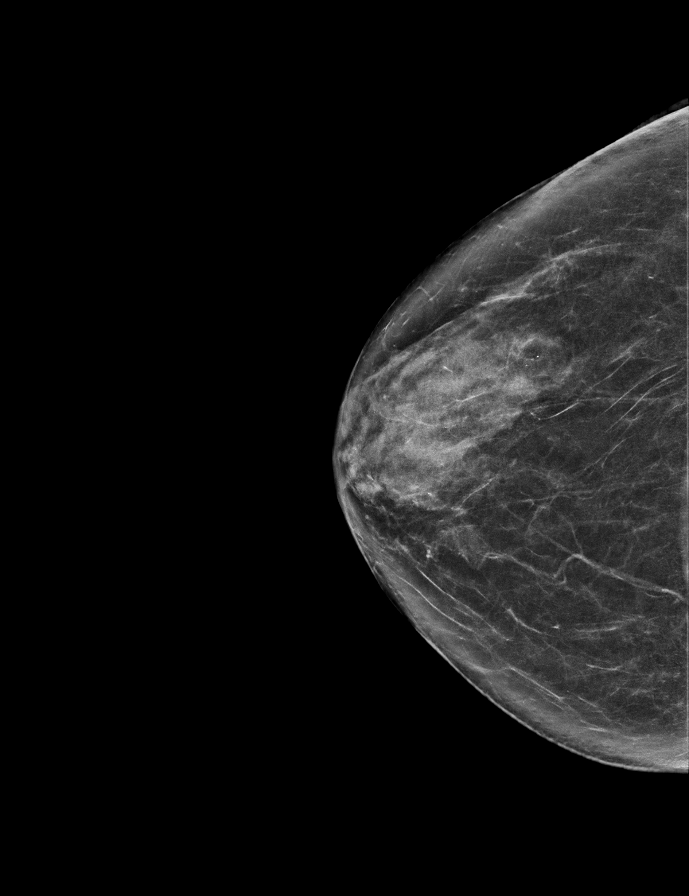

[L CC synth-2D]
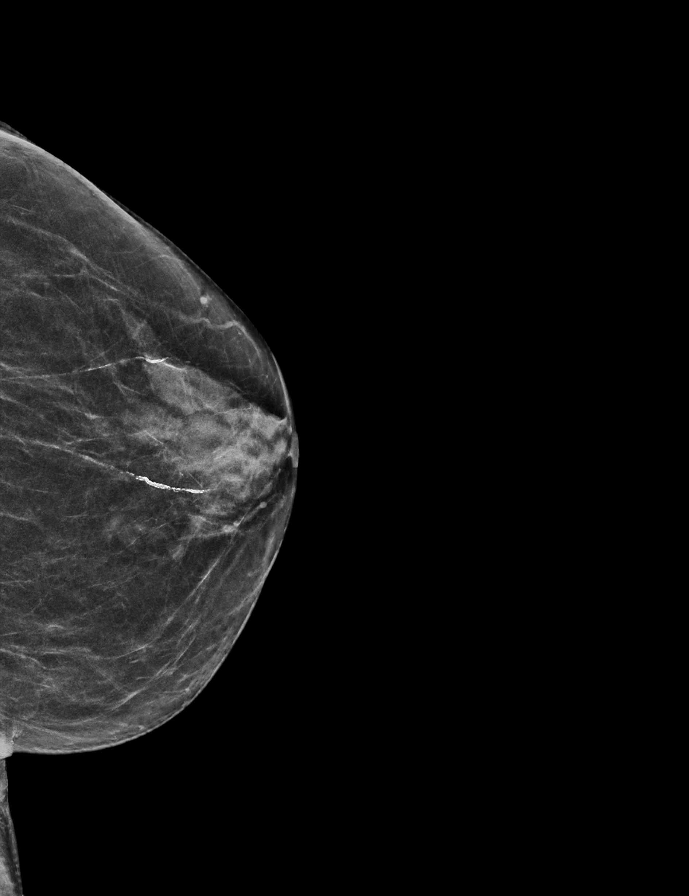

[L CC tomo · 2 of 52 frames shown]
[frame 17/52]
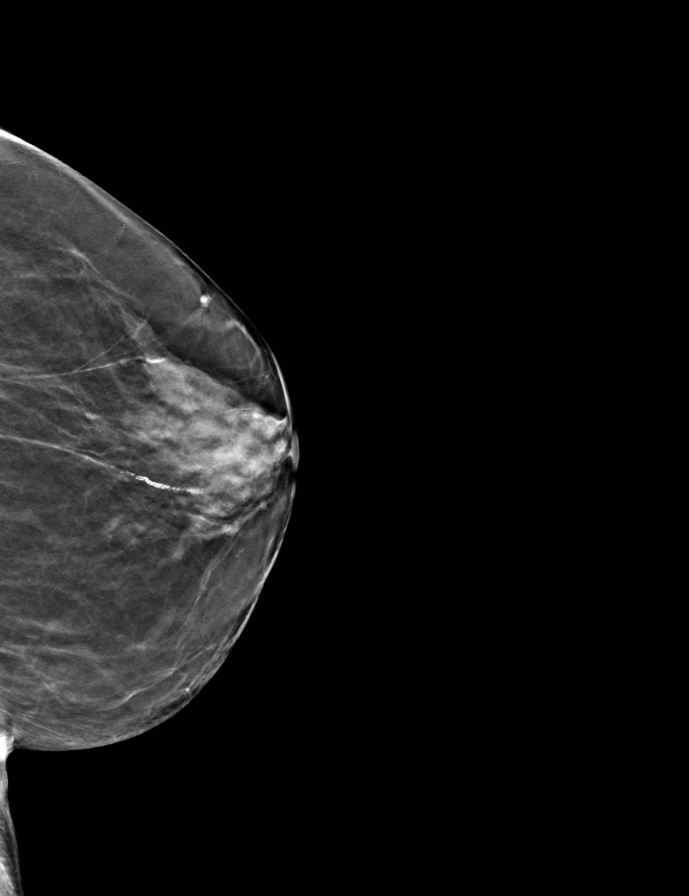
[frame 27/52]
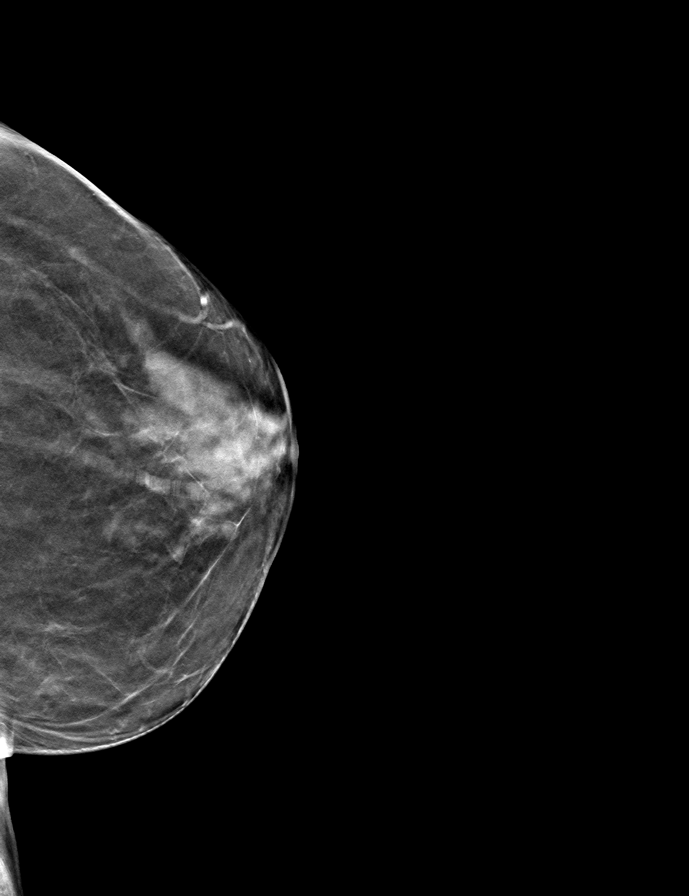

[R MLO tomo · tomo slice 27/54.0]
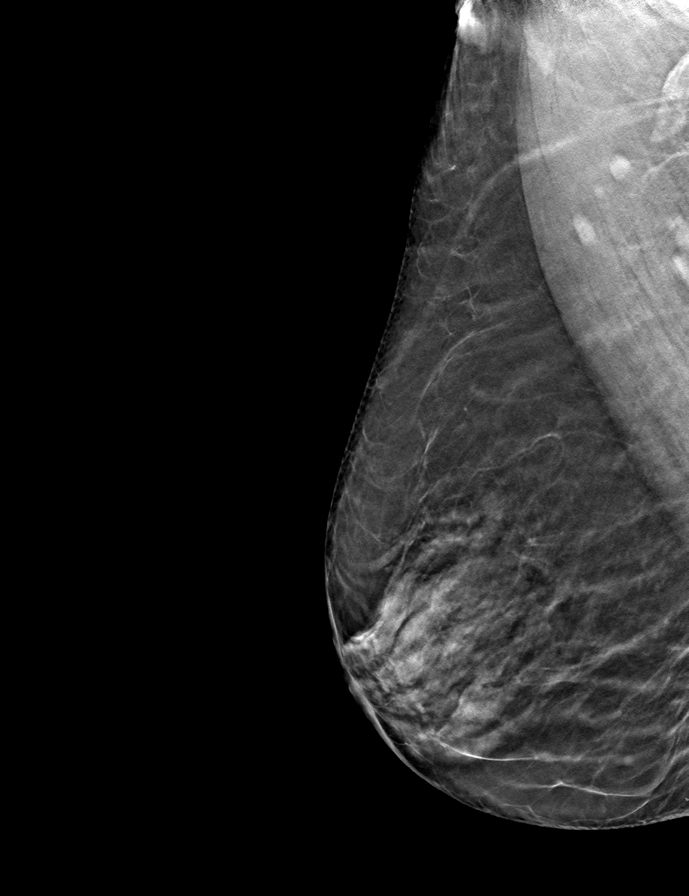

[L MLO tomo · tomo slice 28/55.0]
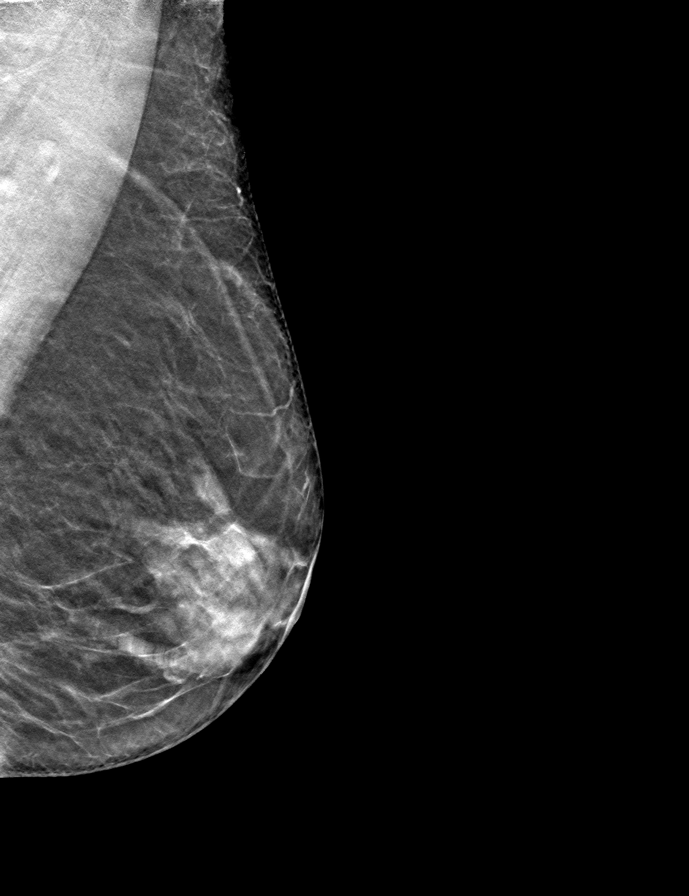

[R CC tomo · tomo slice 33/65.0]
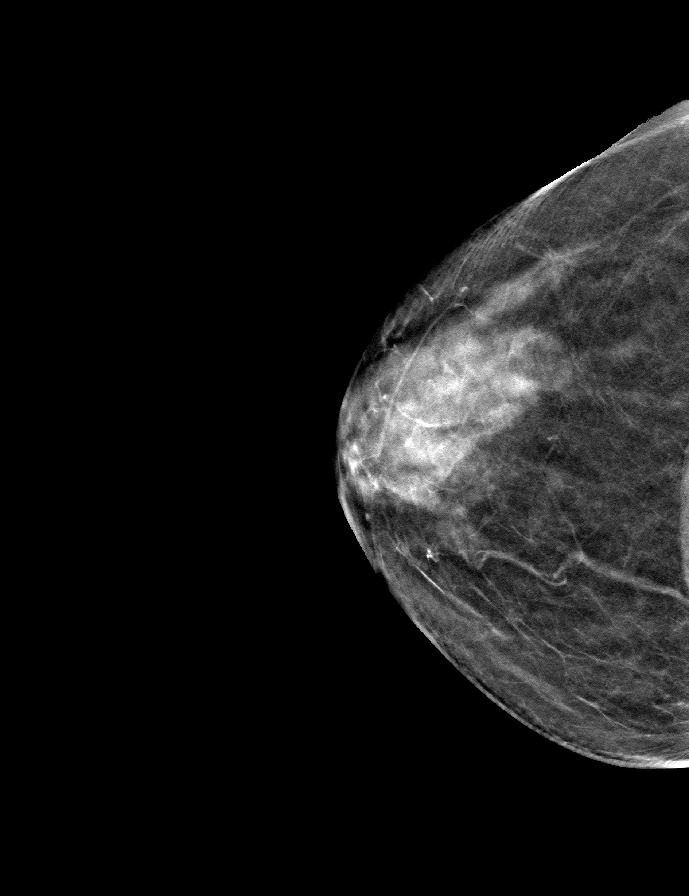

[9 of 24 positions shown; findings below may reference images not displayed]

ACR Breast Density Category c: The breast tissue is heterogeneously
dense, which may obscure small masses.
FINDINGS: There are no findings suspicious for malignancy. The images were
evaluated with computer-aided detection.
IMPRESSION: No mammographic evidence of malignancy. A result letter of this
screening mammogram will be mailed directly to the patient.

RECOMMENDATION:
Screening mammogram in one year. (Code:[id number]-[id number])

BI-RADS CATEGORY  1: Negative.

## 2022-02-15 ENCOUNTER — Other Ambulatory Visit: Payer: Self-pay | Admitting: Family Medicine

## 2022-02-15 DIAGNOSIS — L255 Unspecified contact dermatitis due to plants, except food: Secondary | ICD-10-CM

## 2022-02-17 NOTE — Telephone Encounter (Signed)
Requested medication (s) are due for refill today: no  Requested medication (s) are on the active medication list:yes  Last refill:    Future visit scheduled: yes, with Dr. [name], no longer there  Notes to clinic:  Unable to refill per protocol, last refill by provider not at this practice, routing for approval.     Requested Prescriptions  Pending Prescriptions Disp Refills   famotidine (PEPCID) 40 MG tablet [Pharmacy Med Name: FAMOTIDINE 40MG  TABLETS] 90 tablet 0    Sig: TAKE 1 TABLET(40 MG) BY MOUTH DAILY     Gastroenterology:  H2 Antagonists Passed - 02/15/2022  6:32 AM      Passed - Valid encounter within last 12 months    Recent Outpatient Visits           4 months ago Dermatitis due to plants, including poison ivy, sumac, and oak   [facility] Tally Joe T, FNP   4 months ago Hypertension, unspecified type   [facility] Jerrol Banana., MD   9 months ago Acute non-recurrent pansinusitis   [facility] Thedore Mins, [place], PA-C   10 months ago Annual physical exam   [facility] Jerrol Banana., MD   1 year ago Strain of lumbar region, initial encounter   [facility] Jerrol Banana., MD               hydrOXYzine (ATARAX) 10 MG tablet [Pharmacy Med Name: HYDROXYZINE HCL 10MG  TABLETS] 90 tablet 1    Sig: TAKE 1 TABLET(10 MG) BY MOUTH THREE TIMES DAILY AS NEEDED     Ear, Nose, and Throat:  Antihistamines 2 Passed - 02/15/2022  6:32 AM      Passed - Cr in normal range and within 360 days    Creat  Date Value Ref Range Status  03/18/2017 0.75 0.50 - 0.99 mg/dL Final    Comment:    For patients >45 years of age, the reference limit for Creatinine is approximately 13% higher for people identified as African-American. .    Creatinine, Ser  Date Value Ref Range Status  04/16/2021 0.81 0.57 - 1.00 mg/dL Final         Passed - Valid encounter within last 12 months     Recent Outpatient Visits           4 months ago Dermatitis due to plants, including poison ivy, sumac, and oak   [facility] Tally Joe T, FNP   4 months ago Hypertension, unspecified type   [facility] Jerrol Banana., MD   9 months ago Acute non-recurrent pansinusitis   [facility] Mikey Kirschner, PA-C   10 months ago Annual physical exam   [facility] Jerrol Banana., MD   1 year ago Strain of lumbar region, initial encounter   [facility] Jerrol Banana., MD

## 2022-02-24 DIAGNOSIS — F331 Major depressive disorder, recurrent, moderate: Secondary | ICD-10-CM | POA: Diagnosis not present

## 2022-02-24 DIAGNOSIS — F410 Panic disorder [episodic paroxysmal anxiety] without agoraphobia: Secondary | ICD-10-CM | POA: Diagnosis not present

## 2022-02-24 DIAGNOSIS — F411 Generalized anxiety disorder: Secondary | ICD-10-CM | POA: Diagnosis not present

## 2022-04-13 ENCOUNTER — Other Ambulatory Visit: Payer: Self-pay | Admitting: Family Medicine

## 2022-04-13 DIAGNOSIS — L255 Unspecified contact dermatitis due to plants, except food: Secondary | ICD-10-CM

## 2022-04-14 ENCOUNTER — Telehealth: Payer: Self-pay

## 2022-04-14 NOTE — Telephone Encounter (Unsigned)
Copied from CRM [id number]. Topic: Appointment Scheduling - Scheduling Inquiry for Clinic >> Apr 14, 2022  5:00 PM Franchot Heidelberg wrote: Reason for CRM: Pt called to schedule AWV, no appts available via book it with provider: Nurse Health Advisor. There are scheduling blocks

## 2022-04-15 NOTE — Telephone Encounter (Signed)
Copied from CRM [id number]. Topic: Appointment Scheduling - Scheduling Inquiry for Clinic >> Apr 15, 2022 11:38 AM Melanie Palmer wrote: Reason for CRM: The patient would like a call to reschedule her AWV that was scheduled for tomorrow and had been cancelled as it was with Dr Sullivan Lone. Please assist patient further.

## 2022-04-16 ENCOUNTER — Encounter: Payer: Medicare HMO | Admitting: Family Medicine

## 2022-04-29 ENCOUNTER — Ambulatory Visit (INDEPENDENT_AMBULATORY_CARE_PROVIDER_SITE_OTHER): Payer: Medicare HMO

## 2022-04-29 VITALS — BP 120/80 | Ht 61.0 in | Wt 128.5 lb

## 2022-04-29 DIAGNOSIS — Z0000 Encounter for general adult medical examination without abnormal findings: Secondary | ICD-10-CM

## 2022-04-29 NOTE — Progress Notes (Signed)
Subjective:   Melanie Palmer is a 71 y.o. female who presents for Medicare Annual (Subsequent) preventive examination.  Review of Systems     Cardiac Risk Factors include: advanced age (>29men, >24 women);dyslipidemia     Objective:    Today's Vitals   04/29/22 0912  BP: 120/80  Weight: 128 lb 8 oz (58.3 kg)  Height:  (1.549 m)   Body mass index is 24.28 kg/m.     04/29/2022    9:20 AM 04/25/2020    2:13 PM 03/23/2019    8:45 AM 03/19/2018    8:56 AM 03/18/2017    9:20 AM 11/14/2016    1:53 PM 04/28/2016    4:18 PM  Advanced Directives  Does Patient Have a Medical Advance Directive? No Yes Yes Yes Yes Yes Yes  Type of Advance Directive  Living will Healthcare Power of [place];Living will Healthcare Power of [place];Living will Living will;Healthcare Power of [employer] Power of [place];Living will   Copy of Healthcare Power of Attorney in Chart?   No - copy requested No - copy requested  No - copy requested   Would patient like information on creating a medical advance directive? No - Patient declined          Current Medications (verified) Outpatient Encounter Medications as of 04/29/2022  Medication Sig   clonazePAM (KLONOPIN) 0.5 MG tablet Take 0.25 mg by mouth 2 (two) times daily as needed. Reported on 05/10/2015   famotidine (PEPCID) 40 MG tablet TAKE 1 TABLET(40 MG) BY MOUTH DAILY   fluticasone (FLONASE) 50 MCG/ACT nasal spray SHAKE LIQUID AND USE 2 SPRAYS IN EACH NOSTRIL DAILY   latanoprost (XALATAN) 0.005 % ophthalmic solution 1 drop at bedtime.   rosuvastatin (CRESTOR) 5 MG tablet TAKE 1 TABLET(5 MG) BY MOUTH DAILY   sertraline (ZOLOFT) 100 MG tablet Take 100 mg by mouth daily.    traZODone (DESYREL) 50 MG tablet Take 50 mg by mouth at bedtime.   venlafaxine XR (EFFEXOR-XR) 150 MG 24 hr capsule Take 150 mg by mouth daily.   alendronate (FOSAMAX) 70 MG tablet TAKE 1 TABLET(70 MG) BY MOUTH 1 TIME A WEEK WITH A FULL GLASS OF WATER AND ON AN EMPTY  STOMACH (Patient not taking: Reported on 04/29/2022)   betamethasone dipropionate 0.05 % cream Apply topically 2 (two) times daily. Apply to trunk, limbs. Twice a day. Max of 7 days. Avoid face. (Patient not taking: Reported on 04/29/2022)   cyclobenzaprine (FLEXERIL) 5 MG tablet Take 1 tablet (5 mg total) by mouth 3 (three) times daily as needed for muscle spasms. (Patient not taking: Reported on 04/29/2022)   ferrous sulfate 325 (65 FE) MG EC tablet Take 325 mg by mouth daily with breakfast. (Patient not taking: Reported on 04/29/2022)   hydrOXYzine (ATARAX) 10 MG tablet TAKE 1 TABLET(10 MG) BY MOUTH THREE TIMES DAILY AS NEEDED (Patient not taking: Reported on 04/29/2022)   omeprazole (PRILOSEC) 20 MG capsule TAKE 1 CAPSULE BY MOUTH EVERY DAY (Patient not taking: Reported on 04/29/2022)   predniSONE (DELTASONE) 10 MG tablet Day 1 take 6 tablets Day 2 take 6 tablets Day 3 take 5 tablets Day 4 take 5 tablets Day 5 take 4 tablets Day 6 take 4 tablets Day 7 take 3 tablets Day 8 take 3 tablets Day 9 take 2 tablets Day 10 take 2 tablets Day 11 take 1 tablet Day 12 take 1 tablet Day 13 take 1/2 tablet Day 14 take 1/2 tablet (Patient not taking: Reported on 04/29/2022)  triamcinolone cream (KENALOG) 0.1 % Apply 1 application. topically 2 (two) times daily. Apply to face. Twice a day. Max of 7 days. Use with sun screen. (Patient not taking: Reported on 04/29/2022)   No facility-administered encounter medications on file as of 04/29/2022.    Allergies (verified) Sulfa antibiotics and Penicillins   History: Past Medical History:  Diagnosis Date   Anxiety    Depression    GERD (gastroesophageal reflux disease)    Hypercholesteremia    Past Surgical History:  Procedure Laterality Date   BREAST EXCISIONAL BIOPSY Left    1970's cyst   CESAREAN SECTION     COLONOSCOPY     HEEL SPUR SURGERY     UPPER GI ENDOSCOPY  09/16/2012   normal, no H Pylori, no need to repeat of EGD.   Family History   Problem Relation Age of Onset   Hypertension Mother    Melanoma Mother    Heart disease Father    Congestive Heart Failure Father    Heart disease Sister    Heart disease Son    Breast cancer Other    Colon cancer Neg Hx    Ovarian cancer Neg Hx    Social History   Socioeconomic History   Marital status: Married    Spouse name: Not on file   Number of children: 2   Years of education: Not on file   Highest education level: High school graduate  Occupational History   Not on file  Tobacco Use   Smoking status: Never   Smokeless tobacco: Never  Vaping Use   Vaping Use: Never used  Substance and Sexual Activity   Alcohol use: No   Drug use: No   Sexual activity: Not Currently    Birth control/protection: Post-menopausal  Other Topics Concern   Not on file  Social History Narrative   Not on file   Social Determinants of Health   Financial Resource Strain: Low Risk  (04/29/2022)   Overall Financial Resource Strain (CARDIA)    Difficulty of Paying Living Expenses: Not hard at all  Food Insecurity: No Food Insecurity (04/29/2022)   Hunger Vital Sign    Worried About Running Out of Food in the Last Year: Never true    Ran Out of Food in the Last Year: Never true  Transportation Needs: No Transportation Needs (04/29/2022)   PRAPARE - Administrator, Civil Service (Medical): No    Lack of Transportation (Non-Medical): No  Physical Activity: Sufficiently Active (04/29/2022)   Exercise Vital Sign    Days of Exercise per Week: 2 days    Minutes of Exercise per Session: 100 min  Stress: No Stress Concern Present (04/29/2022)   [employer] of Occupational Health - Occupational Stress Questionnaire    Feeling of Stress : Not at all  Social Connections: Moderately Integrated (04/29/2022)   Social Connection and Isolation Panel [NHANES]    Frequency of Communication with Friends and Family: Three times a week    Frequency of Social Gatherings with Friends  and Family: Three times a week    Attends Religious Services: More than 4 times per year    Active Member of Clubs or Organizations: No    Attends Banker Meetings: Never    Marital Status: Married    Tobacco Counseling Counseling given: Not Answered   Clinical Intake:  Pre-visit preparation completed: Yes  Pain : No/denies pain     Nutritional Risks: None Diabetes: No  How often do  you need to have someone help you when you read instructions, pamphlets, or other written materials from your doctor or pharmacy?: 1 - Never  Diabetic?no  Interpreter Needed?: No  Information entered by :: Kennedy BuckerLorrie Youssef Footman, LPN   Activities of Daily Living    04/29/2022    9:23 AM  In your present state of health, do you have any difficulty performing the following activities:  Hearing? 0  Vision? 0  Difficulty concentrating or making decisions? 0  Walking or climbing stairs? 0  Dressing or bathing? 0  Doing errands, shopping? 0  Preparing Food and eating ? N  Using the Toilet? N  In the past six months, have you accidently leaked urine? N  Do you have problems with loss of bowel control? N  Managing your Medications? N  Managing your Finances? N  Housekeeping or managing your Housekeeping? N    Patient Care Team: Maple HudsonGilbert, Richard L Jr., MD as PCP - General (Family Medicine) Dingeldein, Viviann SpareSteven, MD as Consulting Physician (Ophthalmology) Darliss RidgelKapur, Aarti K, MD as Referring Physician (Psychiatry)  Indicate any recent Medical Services you may have received from other than [facility] providers in the past year (date may be approximate).     Assessment:   This is a routine wellness examination for Melanie DecemberSharon.  Hearing/Vision screen Hearing Screening - Comments:: No aids Vision Screening - Comments:: Wears glasses- [facility]  Dietary issues and exercise activities discussed: Current Exercise Habits: Home exercise routine, Type of exercise: walking, Time (Minutes): 60, Frequency  (Times/Week): 2, Weekly Exercise (Minutes/Week): 120, Intensity: Mild   Goals Addressed             This Visit's Progress    DIET - EAT MORE FRUITS AND VEGETABLES         Depression Screen    04/29/2022    9:18 AM 04/04/2021    9:42 AM 04/25/2020    2:08 PM 03/23/2019    8:46 AM 03/19/2018    9:02 AM 03/19/2018    8:56 AM 02/16/2017   11:43 AM  PHQ 2/9 Scores  PHQ - 2 Score 0 0 0 0 0 0 0  PHQ- 9 Score 0 0   0  1    Fall Risk    04/29/2022    9:21 AM 04/04/2021    9:43 AM 04/25/2020    2:13 PM 03/23/2019    9:02 AM 03/23/2019    8:46 AM  Fall Risk   Falls in the past year? 1 0 0 0 0  Number falls in past yr: 0 0 0 0 0  Injury with Fall? 0 0 0 0 0  Risk for fall due to : History of fall(s) No Fall Risks     Follow up Falls prevention discussed;Falls evaluation completed Falls evaluation completed       FALL RISK PREVENTION PERTAINING TO THE HOME:  Any stairs in or around the home? Yes  If so, are there any without handrails? No  Home free of loose throw rugs in walkways, pet beds, electrical cords, etc? Yes  Adequate lighting in your home to reduce risk of falls? Yes   ASSISTIVE DEVICES UTILIZED TO PREVENT FALLS:  Life alert? No  Use of a cane, walker or w/c? No  Grab bars in the bathroom? No  Shower chair or bench in shower? No  Elevated toilet seat or a handicapped toilet? No   TIMED UP AND GO:  Was the test performed? Yes .  Length of time to ambulate 10  feet: 4 sec.   Gait steady and fast without use of assistive device  Cognitive Function:        11/14/2016    1:58 PM 03/14/2016   10:15 AM  6CIT Screen  What Year? 0 points 0 points  What month? 0 points 0 points  What time? 0 points 0 points  Count back from 20 0 points 0 points  Months in reverse 0 points 0 points  Repeat phrase 4 points 2 points  Total Score 4 points 2 points    Immunizations Immunization History  Administered Date(s) Administered   Fluad Quad(high Dose 65+) 03/15/2019    Influenza Whole 01/28/2016   Influenza, High Dose Seasonal PF 02/09/2015, 02/16/2017, 03/19/2018, 03/07/2021   Influenza-Unspecified 03/02/2020   PFIZER(Purple Top)SARS-COV-2 Vaccination 07/17/2019, 08/10/2019, 04/26/2020   Pneumococcal Conjugate-13 07/06/2014, 02/09/2015   Pneumococcal Polysaccharide-23 03/14/2016   Tdap 10/28/2011   Zoster, Live 10/28/2011    TDAP status: Due, Education has been provided regarding the importance of this vaccine. Advised may receive this vaccine at local pharmacy or Health Dept. Aware to provide a copy of the vaccination record if obtained from local pharmacy or Health Dept. Verbalized acceptance and understanding.  Flu Vaccine status: Up to date  Pneumococcal vaccine status: Up to date  Covid-19 vaccine status: Completed vaccines  Qualifies for Shingles Vaccine? Yes   Zostavax completed Yes   Shingrix Completed?: No.    Education has been provided regarding the importance of this vaccine. Patient has been advised to call insurance company to determine out of pocket expense if they have not yet received this vaccine. Advised may also receive vaccine at local pharmacy or Health Dept. Verbalized acceptance and understanding.  Screening Tests Health Maintenance  Topic Date Due   Zoster Vaccines- Shingrix (1 of 2) Never done   DTaP/Tdap/Td (2 - Td or Tdap) 10/27/2021   INFLUENZA VACCINE  12/17/2021   COVID-19 Vaccine (4 - 2023-24 season) 01/17/2022   COLONOSCOPY (Pts 45-18yrs Insurance coverage will need to be confirmed)  02/13/2023   Medicare Annual Wellness (AWV)  04/30/2023   MAMMOGRAM  07/26/2023   DEXA SCAN  06/12/2025   Pneumonia Vaccine 72+ Years old  Completed   Hepatitis C Screening  Completed   HPV VACCINES  Aged Out    Health Maintenance  Health Maintenance Due  Topic Date Due   Zoster Vaccines- Shingrix (1 of 2) Never done   DTaP/Tdap/Td (2 - Td or Tdap) 10/27/2021   INFLUENZA VACCINE  12/17/2021   COVID-19 Vaccine (4 -  2023-24 season) 01/17/2022    Colorectal cancer screening: Type of screening: Colonoscopy. Completed 02/12/18. Repeat every 5 years  Mammogram status: Completed 07/25/21. Repeat every year  Bone Density status: Completed 06/12/20. Results reflect: Bone density results: NORMAL. Repeat every 5 years.  Lung Cancer Screening: (Low Dose CT Chest recommended if Age 4-80 years, 30 pack-year currently smoking OR have quit w/in 15years.) does not qualify.    Hepatitis C Screening: does qualify; Completed 10/28/11  Vision Screening: Recommended annual ophthalmology exams for early detection of glaucoma and other disorders of the eye. Is the patient up to date with their annual eye exam?  Yes  Who is the provider or what is the name of the office in which the patient attends annual eye exams? [facility] If pt is not established with a provider, would they like to be referred to a provider to establish care? No .   Dental Screening: Recommended annual dental exams for proper oral hygiene  Community Resource Referral / Chronic Care Management: CRR required this visit?  No   CCM required this visit?  No      Plan:     I have personally reviewed and noted the following in the patient's chart:   Medical and social history Use of alcohol, tobacco or illicit drugs  Current medications and supplements including opioid prescriptions. Patient is not currently taking opioid prescriptions. Functional ability and status Nutritional status Physical activity Advanced directives List of other physicians Hospitalizations, surgeries, and ER visits in previous 12 months Vitals Screenings to include cognitive, depression, and falls Referrals and appointments  In addition, I have reviewed and discussed with patient certain preventive protocols, quality metrics, and best practice recommendations. A written personalized care plan for preventive services as well as general preventive health recommendations  were provided to patient.     Hal Hope, LPN   [id number]/02/[id number]   Nurse Notes: none

## 2022-04-29 NOTE — Patient Instructions (Addendum)
Melanie Palmer , Thank you for taking time to come for your Medicare Wellness Visit. I appreciate your ongoing commitment to your health goals. Please review the following plan we discussed and let me know if I can assist you in the future.   Screening recommendations/referrals: Colonoscopy: 02/12/18 Mammogram: 07/25/21 Bone Density: 06/12/20 Recommended yearly ophthalmology/optometry visit for glaucoma screening and checkup Recommended yearly dental visit for hygiene and checkup  Vaccinations: Influenza vaccine: 03/07/21 Pneumococcal vaccine: 03/14/16 Tdap vaccine: 10/28/11, due if have injury Shingles vaccine: Zostavax 10/28/11   Covid-19:07/17/19, 08/10/19, 04/26/20  Advanced directives: no  Conditions/risks identified: none  Next appointment: Follow up in one year for your annual wellness visit 05/04/23 @ 9:45 am in person   Preventive Care 65 Years and Older, Female Preventive care refers to lifestyle choices and visits with your health care provider that can promote health and wellness. What does preventive care include? A yearly physical exam. This is also called an annual well check. Dental exams once or twice a year. Routine eye exams. Ask your health care provider how often you should have your eyes checked. Personal lifestyle choices, including: Daily care of your teeth and gums. Regular physical activity. Eating a healthy diet. Avoiding tobacco and drug use. Limiting alcohol use. Practicing safe sex. Taking low-dose aspirin every day. Taking vitamin and mineral supplements as recommended by your health care provider. What happens during an annual well check? The services and screenings done by your health care provider during your annual well check will depend on your age, overall health, lifestyle risk factors, and family history of disease. Counseling  Your health care provider may ask you questions about your: Alcohol use. Tobacco use. Drug use. Emotional  well-being. Home and relationship well-being. Sexual activity. Eating habits. History of falls. Memory and ability to understand (cognition). Work and work Astronomer. Reproductive health. Screening  You may have the following tests or measurements: Height, weight, and BMI. Blood pressure. Lipid and cholesterol levels. These may be checked every 5 years, or more frequently if you are over 45 years old. Skin check. Lung cancer screening. You may have this screening every year starting at age 42 if you have a 30-pack-year history of smoking and currently smoke or have quit within the past 15 years. Fecal occult blood test (FOBT) of the stool. You may have this test every year starting at age 45. Flexible sigmoidoscopy or colonoscopy. You may have a sigmoidoscopy every 5 years or a colonoscopy every 10 years starting at age 42. Hepatitis C blood test. Hepatitis B blood test. Sexually transmitted disease (STD) testing. Diabetes screening. This is done by checking your blood sugar (glucose) after you have not eaten for a while (fasting). You may have this done every 1-3 years. Bone density scan. This is done to screen for osteoporosis. You may have this done starting at age 19. Mammogram. This may be done every 1-2 years. Talk to your health care provider about how often you should have regular mammograms. Talk with your health care provider about your test results, treatment options, and if necessary, the need for more tests. Vaccines  Your health care provider may recommend certain vaccines, such as: Influenza vaccine. This is recommended every year. Tetanus, diphtheria, and acellular pertussis (Tdap, Td) vaccine. You may need a Td booster every 10 years. Zoster vaccine. You may need this after age 68. Pneumococcal 13-valent conjugate (PCV13) vaccine. One dose is recommended after age 82. Pneumococcal polysaccharide (PPSV23) vaccine. One dose is recommended after age 25.  Talk to your  health care provider about which screenings and vaccines you need and how often you need them. This information is not intended to replace advice given to you by your health care provider. Make sure you discuss any questions you have with your health care provider. Document Released: 06/01/2015 Document Revised: 01/23/2016 Document Reviewed: 03/06/2015 Elsevier Interactive Patient Education  2017 [employer] Prevention in the Home Falls can cause injuries. They can happen to people of all ages. There are many things you can do to make your home safe and to help prevent falls. What can I do on the outside of my home? Regularly fix the edges of walkways and driveways and fix any cracks. Remove anything that might make you trip as you walk through a door, such as a raised step or threshold. Trim any bushes or trees on the path to your home. Use bright outdoor lighting. Clear any walking paths of anything that might make someone trip, such as rocks or tools. Regularly check to see if handrails are loose or broken. Make sure that both sides of any steps have handrails. Any raised decks and porches should have guardrails on the edges. Have any leaves, snow, or ice cleared regularly. Use sand or salt on walking paths during winter. Clean up any spills in your garage right away. This includes oil or grease spills. What can I do in the bathroom? Use night lights. Install grab bars by the toilet and in the tub and shower. Do not use towel bars as grab bars. Use non-skid mats or decals in the tub or shower. If you need to sit down in the shower, use a plastic, non-slip stool. Keep the floor dry. Clean up any water that spills on the floor as soon as it happens. Remove soap buildup in the tub or shower regularly. Attach bath mats securely with double-sided non-slip rug tape. Do not have throw rugs and other things on the floor that can make you trip. What can I do in the bedroom? Use night  lights. Make sure that you have a light by your bed that is easy to reach. Do not use any sheets or blankets that are too big for your bed. They should not hang down onto the floor. Have a firm chair that has side arms. You can use this for support while you get dressed. Do not have throw rugs and other things on the floor that can make you trip. What can I do in the kitchen? Clean up any spills right away. Avoid walking on wet floors. Keep items that you use a lot in easy-to-reach places. If you need to reach something above you, use a strong step stool that has a grab bar. Keep electrical cords out of the way. Do not use floor polish or wax that makes floors slippery. If you must use wax, use non-skid floor wax. Do not have throw rugs and other things on the floor that can make you trip. What can I do with my stairs? Do not leave any items on the stairs. Make sure that there are handrails on both sides of the stairs and use them. Fix handrails that are broken or loose. Make sure that handrails are as long as the stairways. Check any carpeting to make sure that it is firmly attached to the stairs. Fix any carpet that is loose or worn. Avoid having throw rugs at the top or bottom of the stairs. If you do have throw  rugs, attach them to the floor with carpet tape. Make sure that you have a light switch at the top of the stairs and the bottom of the stairs. If you do not have them, ask someone to add them for you. What else can I do to help prevent falls? Wear shoes that: Do not have high heels. Have rubber bottoms. Are comfortable and fit you well. Are closed at the toe. Do not wear sandals. If you use a stepladder: Make sure that it is fully opened. Do not climb a closed stepladder. Make sure that both sides of the stepladder are locked into place. Ask someone to hold it for you, if possible. Clearly mark and make sure that you can see: Any grab bars or handrails. First and last  steps. Where the edge of each step is. Use tools that help you move around (mobility aids) if they are needed. These include: Canes. Walkers. Scooters. Crutches. Turn on the lights when you go into a dark area. Replace any light bulbs as soon as they burn out. Set up your furniture so you have a clear path. Avoid moving your furniture around. If any of your floors are uneven, fix them. If there are any pets around you, be aware of where they are. Review your medicines with your doctor. Some medicines can make you feel dizzy. This can increase your chance of falling. Ask your doctor what other things that you can do to help prevent falls. This information is not intended to replace advice given to you by your health care provider. Make sure you discuss any questions you have with your health care provider. Document Released: 03/01/2009 Document Revised: 10/11/2015 Document Reviewed: 06/09/2014 Elsevier Interactive Patient Education  2017 [employer].

## 2022-05-09 ENCOUNTER — Other Ambulatory Visit: Payer: Self-pay | Admitting: Family Medicine

## 2022-05-13 ENCOUNTER — Other Ambulatory Visit: Payer: Self-pay | Admitting: Family Medicine

## 2022-05-13 DIAGNOSIS — L255 Unspecified contact dermatitis due to plants, except food: Secondary | ICD-10-CM

## 2022-05-13 DIAGNOSIS — K219 Gastro-esophageal reflux disease without esophagitis: Secondary | ICD-10-CM

## 2022-05-13 NOTE — Telephone Encounter (Signed)
omeprazole (PRILOSEC) 20

## 2022-05-20 MED ORDER — OMEPRAZOLE 20 MG PO CPDR: DELAYED_RELEASE_CAPSULE | ORAL | 1 refills | Status: AC

## 2022-05-20 NOTE — Telephone Encounter (Signed)
Patient called and advised Famotidine was sent to the pharmacy on 05/13/22. She says she doesn't take that and has been on Omeprazole for years, so that's the one she would like to continue taking and is out of the medication. Advised I will send in the request for refill of Omeprazole.

## 2022-05-20 NOTE — Telephone Encounter (Signed)
Pt is calling to check on the status of medication. Pt reports that she is completely out of medicatication. Please advise

## 2022-05-20 NOTE — Telephone Encounter (Signed)
Requested Prescriptions  Pending Prescriptions Disp Refills   omeprazole (PRILOSEC) 20 MG capsule 180 capsule 1    Sig: TAKE 1 CAPSULE BY MOUTH EVERY DAY     Gastroenterology: Proton Pump Inhibitors Passed - 05/20/2022 11:52 AM      Passed - Valid encounter within last 12 months    Recent Outpatient Visits           7 months ago Dermatitis due to plants, including poison ivy, sumac, and oak   [facility] Tally Joe T, FNP   7 months ago Hypertension, unspecified type   [facility] Jerrol Banana., MD   1 year ago Acute non-recurrent pansinusitis   [facility] Mikey Kirschner, PA-C   1 year ago Annual physical exam   [facility] Jerrol Banana., MD   1 year ago Strain of lumbar region, initial encounter   [facility] Jerrol Banana., MD              Signed Prescriptions Disp Refills   famotidine (PEPCID) 40 MG tablet 90 tablet 0    Sig: TAKE 1 TABLET(40 MG) BY MOUTH DAILY     Gastroenterology:  H2 Antagonists Passed - 05/13/2022  6:36 AM      Passed - Valid encounter within last 12 months    Recent Outpatient Visits           7 months ago Dermatitis due to plants, including poison ivy, sumac, and oak   [facility] Tally Joe T, FNP   7 months ago Hypertension, unspecified type   [facility] Jerrol Banana., MD   1 year ago Acute non-recurrent pansinusitis   [facility] Mikey Kirschner, PA-C   1 year ago Annual physical exam   [facility] Jerrol Banana., MD   1 year ago Strain of lumbar region, initial encounter   [facility] Jerrol Banana., MD

## 2022-06-02 ENCOUNTER — Encounter: Payer: Self-pay | Admitting: Family Medicine

## 2022-06-11 ENCOUNTER — Other Ambulatory Visit: Payer: Self-pay | Admitting: Family Medicine

## 2022-06-11 DIAGNOSIS — L255 Unspecified contact dermatitis due to plants, except food: Secondary | ICD-10-CM

## 2022-06-24 ENCOUNTER — Other Ambulatory Visit: Payer: Self-pay | Admitting: Family Medicine

## 2022-06-24 ENCOUNTER — Telehealth: Payer: Self-pay

## 2022-06-24 DIAGNOSIS — F331 Major depressive disorder, recurrent, moderate: Secondary | ICD-10-CM | POA: Diagnosis not present

## 2022-06-24 DIAGNOSIS — F410 Panic disorder [episodic paroxysmal anxiety] without agoraphobia: Secondary | ICD-10-CM | POA: Diagnosis not present

## 2022-06-24 DIAGNOSIS — F411 Generalized anxiety disorder: Secondary | ICD-10-CM | POA: Diagnosis not present

## 2022-06-24 DIAGNOSIS — R4189 Other symptoms and signs involving cognitive functions and awareness: Secondary | ICD-10-CM

## 2022-06-24 NOTE — Telephone Encounter (Signed)
Copied from [place]. Topic: General - Other >> Jun 24, 2022  3:33 PM Chapman Fitch wrote: Reason for CRM: Dr Nicolasa Ducking advised pt to let Melanie Palmer know that she has to have a brain scan and an appt being made to see a neurologist for dementia / please advise

## 2022-06-25 NOTE — Telephone Encounter (Signed)
Mrs. Crissie was requesting supplies for CPAP for her husband.

## 2022-06-27 ENCOUNTER — Telehealth: Payer: Self-pay

## 2022-06-27 NOTE — Telephone Encounter (Signed)
Copied from [place] [id number]. Topic: General - Other >> Jun 27, 2022 11:47 AM Sabas Sous wrote: Reason for CRM: Pt returned missed call from clinic, please advise

## 2022-06-30 NOTE — Telephone Encounter (Signed)
Please see mychart message why she was called.

## 2022-07-07 DIAGNOSIS — H401131 Primary open-angle glaucoma, bilateral, mild stage: Secondary | ICD-10-CM | POA: Diagnosis not present

## 2022-07-07 DIAGNOSIS — Z0100 Encounter for examination of eyes and vision without abnormal findings: Secondary | ICD-10-CM | POA: Diagnosis not present

## 2022-07-17 DIAGNOSIS — Z8659 Personal history of other mental and behavioral disorders: Secondary | ICD-10-CM | POA: Diagnosis not present

## 2022-07-17 DIAGNOSIS — R296 Repeated falls: Secondary | ICD-10-CM | POA: Diagnosis not present

## 2022-07-17 DIAGNOSIS — Z118 Encounter for screening for other infectious and parasitic diseases: Secondary | ICD-10-CM | POA: Diagnosis not present

## 2022-07-17 DIAGNOSIS — Z87898 Personal history of other specified conditions: Secondary | ICD-10-CM | POA: Diagnosis not present

## 2022-07-17 DIAGNOSIS — G3184 Mild cognitive impairment, so stated: Secondary | ICD-10-CM | POA: Diagnosis not present

## 2022-07-21 ENCOUNTER — Other Ambulatory Visit (HOSPITAL_COMMUNITY): Payer: Self-pay | Admitting: Neurology

## 2022-07-21 DIAGNOSIS — G3184 Mild cognitive impairment, so stated: Secondary | ICD-10-CM

## 2022-07-24 DIAGNOSIS — F411 Generalized anxiety disorder: Secondary | ICD-10-CM | POA: Diagnosis not present

## 2022-07-24 DIAGNOSIS — F331 Major depressive disorder, recurrent, moderate: Secondary | ICD-10-CM | POA: Diagnosis not present

## 2022-07-24 DIAGNOSIS — F410 Panic disorder [episodic paroxysmal anxiety] without agoraphobia: Secondary | ICD-10-CM | POA: Diagnosis not present

## 2022-07-25 DIAGNOSIS — R55 Syncope and collapse: Secondary | ICD-10-CM | POA: Diagnosis not present

## 2022-07-25 DIAGNOSIS — F411 Generalized anxiety disorder: Secondary | ICD-10-CM | POA: Diagnosis not present

## 2022-07-25 DIAGNOSIS — K219 Gastro-esophageal reflux disease without esophagitis: Secondary | ICD-10-CM | POA: Diagnosis not present

## 2022-07-25 DIAGNOSIS — E785 Hyperlipidemia, unspecified: Secondary | ICD-10-CM | POA: Diagnosis not present

## 2022-08-01 ENCOUNTER — Ambulatory Visit (HOSPITAL_COMMUNITY)
Admission: RE | Admit: 2022-08-01 | Discharge: 2022-08-01 | Disposition: A | Discharge status: Home / Self Care | Payer: Medicare HMO | Source: Ambulatory Visit | Attending: Neurology | Admitting: Neurology

## 2022-08-01 DIAGNOSIS — G319 Degenerative disease of nervous system, unspecified: Secondary | ICD-10-CM | POA: Diagnosis not present

## 2022-08-01 DIAGNOSIS — G3184 Mild cognitive impairment, so stated: Secondary | ICD-10-CM | POA: Diagnosis not present

## 2022-08-03 DIAGNOSIS — R4182 Altered mental status, unspecified: Secondary | ICD-10-CM | POA: Diagnosis not present

## 2022-08-07 DIAGNOSIS — R55 Syncope and collapse: Secondary | ICD-10-CM | POA: Diagnosis not present

## 2022-08-11 DIAGNOSIS — R55 Syncope and collapse: Secondary | ICD-10-CM | POA: Diagnosis not present

## 2022-08-20 DIAGNOSIS — R55 Syncope and collapse: Secondary | ICD-10-CM | POA: Diagnosis not present

## 2022-08-20 DIAGNOSIS — K219 Gastro-esophageal reflux disease without esophagitis: Secondary | ICD-10-CM | POA: Diagnosis not present

## 2022-08-20 DIAGNOSIS — F411 Generalized anxiety disorder: Secondary | ICD-10-CM | POA: Diagnosis not present

## 2022-08-20 DIAGNOSIS — R42 Dizziness and giddiness: Secondary | ICD-10-CM | POA: Diagnosis not present

## 2022-08-20 DIAGNOSIS — E785 Hyperlipidemia, unspecified: Secondary | ICD-10-CM | POA: Diagnosis not present

## 2022-09-01 NOTE — Progress Notes (Deleted)
      Established patient visit   Patient: Melanie Palmer   DOB: 21-Jul-1950   72 y.o. Female  MRN: [id number] Visit Date: 09/02/2022  Today's healthcare provider: Jacky Kindle, FNP   No chief complaint on file.  Subjective    HPI  ***  Medications: Outpatient Medications Prior to Visit  Medication Sig   alendronate (FOSAMAX) 70 MG tablet TAKE 1 TABLET(70 MG) BY MOUTH 1 TIME A WEEK WITH A FULL GLASS OF WATER AND ON AN EMPTY STOMACH (Patient not taking: Reported on 04/29/2022)   betamethasone dipropionate 0.05 % cream Apply topically 2 (two) times daily. Apply to trunk, limbs. Twice a day. Max of 7 days. Avoid face. (Patient not taking: Reported on 04/29/2022)   clonazePAM (KLONOPIN) 0.5 MG tablet Take 0.25 mg by mouth 2 (two) times daily as needed. Reported on 05/10/2015   cyclobenzaprine (FLEXERIL) 5 MG tablet Take 1 tablet (5 mg total) by mouth 3 (three) times daily as needed for muscle spasms. (Patient not taking: Reported on 04/29/2022)   famotidine (PEPCID) 40 MG tablet TAKE 1 TABLET(40 MG) BY MOUTH DAILY   ferrous sulfate 325 (65 FE) MG EC tablet Take 325 mg by mouth daily with breakfast. (Patient not taking: Reported on 04/29/2022)   fluticasone (FLONASE) 50 MCG/ACT nasal spray SHAKE LIQUID AND USE 2 SPRAYS IN EACH NOSTRIL DAILY   hydrOXYzine (ATARAX) 10 MG tablet TAKE 1 TABLET(10 MG) BY MOUTH THREE TIMES DAILY AS NEEDED (Patient not taking: Reported on 04/29/2022)   latanoprost (XALATAN) 0.005 % ophthalmic solution 1 drop at bedtime.   omeprazole (PRILOSEC) 20 MG capsule TAKE 1 CAPSULE BY MOUTH EVERY DAY   predniSONE (DELTASONE) 10 MG tablet Day 1 take 6 tablets Day 2 take 6 tablets Day 3 take 5 tablets Day 4 take 5 tablets Day 5 take 4 tablets Day 6 take 4 tablets Day 7 take 3 tablets Day 8 take 3 tablets Day 9 take 2 tablets Day 10 take 2 tablets Day 11 take 1 tablet Day 12 take 1 tablet Day 13 take 1/2 tablet Day 14 take 1/2 tablet (Patient not taking: Reported on  04/29/2022)   rosuvastatin (CRESTOR) 5 MG tablet TAKE 1 TABLET(5 MG) BY MOUTH DAILY   sertraline (ZOLOFT) 100 MG tablet Take 100 mg by mouth daily.    traZODone (DESYREL) 50 MG tablet Take 50 mg by mouth at bedtime.   triamcinolone cream (KENALOG) 0.1 % Apply 1 application. topically 2 (two) times daily. Apply to face. Twice a day. Max of 7 days. Use with sun screen. (Patient not taking: Reported on 04/29/2022)   venlafaxine XR (EFFEXOR-XR) 150 MG 24 hr capsule Take 150 mg by mouth daily.   No facility-administered medications prior to visit.    Review of Systems  {Labs  Heme  Chem  Endocrine  Serology  Results Review (optional):23779}   Objective    There were no vitals taken for this visit. {Show previous vital signs (optional):23777}  Physical Exam  ***  No results found for any visits on 09/02/22.  Assessment & Plan     ***  No follow-ups on file.      {provider attestation***:1}   Jacky Kindle, FNP  [facility] [phone] (phone) [phone] (fax)  [facility]

## 2022-09-02 ENCOUNTER — Ambulatory Visit: Payer: Medicare HMO | Admitting: Family Medicine

## 2022-09-03 ENCOUNTER — Other Ambulatory Visit: Payer: Self-pay

## 2022-09-03 ENCOUNTER — Telehealth: Payer: Self-pay | Admitting: Family Medicine

## 2022-09-03 NOTE — Telephone Encounter (Signed)
[facility] pharmacy requesting prescription refill alendronate (FOSAMAX) 70 MG tablet  Please advise

## 2022-09-04 MED ORDER — ALENDRONATE SODIUM 70 MG PO TABS: ORAL_TABLET | ORAL | 4 refills | Status: AC

## 2022-09-08 NOTE — Progress Notes (Unsigned)
I,J'ya E Greene Diodato,acting as a scribe for Jacky Kindle, FNP.,have documented all relevant documentation on the behalf of Jacky Kindle, FNP,as directed by  Jacky Kindle, FNP while in the presence of Jacky Kindle, FNP.   Established patient visit   Patient: Melanie Palmer   DOB: [birth year]   72 y.o. Female  MRN: [id number] Visit Date: 09/09/2022  Today's healthcare provider: Jacky Kindle, FNP   Introduced to nurse practitioner role and practice setting.  All questions answered.  Discussed provider/patient relationship and expectations.  Subjective    HPI    Medications: Outpatient Medications Prior to Visit  Medication Sig   alendronate (FOSAMAX) 70 MG tablet TAKE 1 TABLET(70 MG) BY MOUTH 1 TIME A WEEK WITH A FULL GLASS OF WATER AND ON AN EMPTY STOMACH   clonazePAM (KLONOPIN) 0.5 MG tablet Take 0.25 mg by mouth 2 (two) times daily as needed. Reported on 05/10/2015   famotidine (PEPCID) 40 MG tablet TAKE 1 TABLET(40 MG) BY MOUTH DAILY   fluticasone (FLONASE) 50 MCG/ACT nasal spray SHAKE LIQUID AND USE 2 SPRAYS IN EACH NOSTRIL DAILY   latanoprost (XALATAN) 0.005 % ophthalmic solution 1 drop at bedtime.   omeprazole (PRILOSEC) 20 MG capsule TAKE 1 CAPSULE BY MOUTH EVERY DAY   rosuvastatin (CRESTOR) 5 MG tablet TAKE 1 TABLET(5 MG) BY MOUTH DAILY   sertraline (ZOLOFT) 100 MG tablet Take 100 mg by mouth daily.    traZODone (DESYREL) 50 MG tablet Take 50 mg by mouth at bedtime.   venlafaxine XR (EFFEXOR-XR) 150 MG 24 hr capsule Take 150 mg by mouth daily.   [DISCONTINUED] betamethasone dipropionate 0.05 % cream Apply topically 2 (two) times daily. Apply to trunk, limbs. Twice a day. Max of 7 days. Avoid face. (Patient not taking: Reported on 04/29/2022)   [DISCONTINUED] cyclobenzaprine (FLEXERIL) 5 MG tablet Take 1 tablet (5 mg total) by mouth 3 (three) times daily as needed for muscle spasms. (Patient not taking: Reported on 04/29/2022)   [DISCONTINUED] ferrous sulfate 325 (65 FE)  MG EC tablet Take 325 mg by mouth daily with breakfast. (Patient not taking: Reported on 04/29/2022)   [DISCONTINUED] hydrOXYzine (ATARAX) 10 MG tablet TAKE 1 TABLET(10 MG) BY MOUTH THREE TIMES DAILY AS NEEDED (Patient not taking: Reported on 04/29/2022)   [DISCONTINUED] predniSONE (DELTASONE) 10 MG tablet Day 1 take 6 tablets Day 2 take 6 tablets Day 3 take 5 tablets Day 4 take 5 tablets Day 5 take 4 tablets Day 6 take 4 tablets Day 7 take 3 tablets Day 8 take 3 tablets Day 9 take 2 tablets Day 10 take 2 tablets Day 11 take 1 tablet Day 12 take 1 tablet Day 13 take 1/2 tablet Day 14 take 1/2 tablet (Patient not taking: Reported on 04/29/2022)   [DISCONTINUED] triamcinolone cream (KENALOG) 0.1 % Apply 1 application. topically 2 (two) times daily. Apply to face. Twice a day. Max of 7 days. Use with sun screen. (Patient not taking: Reported on 04/29/2022)   No facility-administered medications prior to visit.    Review of Systems    Objective    BP (!) 127/57 (BP Location: Right Arm, Patient Position: Sitting, Cuff Size: Normal)   Pulse 79   Temp 97.8 F (36.6 C) (Oral)   Resp 12   Ht 5\' 1"  (1.549 m)   Wt 130 lb 8 oz (59.2 kg)   SpO2 99%   BMI 24.66 kg/m   Physical Exam Vitals and nursing note reviewed.  Constitutional:  General: She is not in acute distress.    Appearance: Normal appearance. She is normal weight. She is not ill-appearing, toxic-appearing or diaphoretic.  HENT:     Head: Normocephalic and atraumatic.  Cardiovascular:     Rate and Rhythm: Normal rate and regular rhythm.     Pulses: Normal pulses.     Heart sounds: Normal heart sounds. No murmur heard.    No friction rub. No gallop.  Pulmonary:     Effort: Pulmonary effort is normal. No respiratory distress.     Breath sounds: Normal breath sounds. No stridor. No wheezing, rhonchi or rales.  Chest:     Chest wall: No tenderness.  Musculoskeletal:        General: No swelling, tenderness, deformity or signs of  injury. Normal range of motion.     Right lower leg: No edema.     Left lower leg: No edema.  Skin:    General: Skin is warm and dry.     Capillary Refill: Capillary refill takes less than 2 seconds.     Coloration: Skin is not jaundiced or pale.     Findings: No bruising, erythema, lesion or rash.  Neurological:     General: No focal deficit present.     Mental Status: She is alert and oriented to person, place, and time. Mental status is at baseline.     Cranial Nerves: No cranial nerve deficit.     Sensory: No sensory deficit.     Motor: No weakness.     Coordination: Coordination normal.  Psychiatric:        Mood and Affect: Mood normal.        Behavior: Behavior normal.        Thought Content: Thought content normal.        Judgment: Judgment normal.    No results found for any visits on 09/09/22.  Assessment & Plan     Problem List Items Addressed This Visit       Cardiovascular and Mediastinum   Primary hypertension    Chronic, well controlled Remains off medications at this time; SBPs in 120s Repeat CBC and CMP Followed by cardiology, [place]      Relevant Orders   CBC with Differential/Platelet   Comprehensive Metabolic Panel (CMET)     Digestive   Acid reflux    Chronic, gerd without esophagus involvement Remains on low dose prilosec at 20 mg Denies current concerns         Endocrine   Adult hypothyroidism - Primary    Chronic, off medication Continues to note some mood difficulties as well as sleep disturbance Repeat labs Vitals, weight stable Pt reports mood as stable       Relevant Orders   TSH + free T4     Other   Depression, recurrent       09/09/2022    9:03 AM 04/29/2022    9:18 AM 04/04/2021    9:42 AM  PHQ9 SCORE ONLY  PHQ-9 Total Score 0 0 0  Chronic, recurrent, well controlled Remains on both zoloft at 100 as well as effexor 150 mg as managed by psych Denies concern for SI or HI      Elevated glucose    Recommend A1c  given previous elevated glucose Continue to recommend balanced, lower carb meals. Smaller meal size, adding snacks. Choosing water as drink of choice and increasing purposeful exercise.       Relevant Orders   Hemoglobin A1c   Elevated  LDL cholesterol level    LDL previously elevated despite use of crestor 5 mg Repeat LP LDL goal of <100 without known ASCVD or DM; A1c pending at this time  recommend diet low in saturated fat and regular exercise - 30 min at least 5 times per week       Relevant Orders   Lipid panel   Encounter for screening mammogram for malignant neoplasm of breast    Due for screening for mammogram, denies breast concerns, provided with phone number to call and schedule appointment for mammogram. Encouraged to repeat breast cancer screening every 1-2 years.       Relevant Orders   MM 3D SCREENING MAMMOGRAM BILATERAL BREAST   Insomnia, persistent    Chronic, stable with use of trazodone nightly at 50 mg       Microcytic anemia    Without concern for fatigue or pallor; no ecchymosis noted Repeat CBC with diff; patient is off iron at this time       Relevant Orders   CBC with Differential/Platelet   Panic attacks    Chronic, well controlled with use of both zoloft 100 as well as effexor 150 and low dose klonopin; provided by psych PDMP reviewed      Post-menopausal    Due for repeat DEXA given known OP on fosamax 70      Relevant Orders   DG Bone Density   Return in about 8 months (around 05/11/2023) for annual examination.     Leilani Merl, FNP, have reviewed all documentation for this visit. The documentation on 09/09/22 for the exam, diagnosis, procedures, and orders are all accurate and complete.  Jacky Kindle, FNP  [facility] [phone] (phone) [phone] (fax)  [facility]

## 2022-09-09 ENCOUNTER — Encounter: Payer: Self-pay | Admitting: Family Medicine

## 2022-09-09 ENCOUNTER — Ambulatory Visit (INDEPENDENT_AMBULATORY_CARE_PROVIDER_SITE_OTHER): Payer: Medicare HMO | Admitting: Family Medicine

## 2022-09-09 VITALS — BP 127/57 | HR 79 | Temp 97.8°F | Resp 12 | Ht 61.0 in | Wt 130.5 lb

## 2022-09-09 DIAGNOSIS — F339 Major depressive disorder, recurrent, unspecified: Secondary | ICD-10-CM | POA: Insufficient documentation

## 2022-09-09 DIAGNOSIS — D509 Iron deficiency anemia, unspecified: Secondary | ICD-10-CM | POA: Diagnosis not present

## 2022-09-09 DIAGNOSIS — Z780 Asymptomatic menopausal state: Secondary | ICD-10-CM | POA: Insufficient documentation

## 2022-09-09 DIAGNOSIS — F410 Panic disorder [episodic paroxysmal anxiety] without agoraphobia: Secondary | ICD-10-CM

## 2022-09-09 DIAGNOSIS — E039 Hypothyroidism, unspecified: Secondary | ICD-10-CM | POA: Diagnosis not present

## 2022-09-09 DIAGNOSIS — E7800 Pure hypercholesterolemia, unspecified: Secondary | ICD-10-CM | POA: Diagnosis not present

## 2022-09-09 DIAGNOSIS — G4700 Insomnia, unspecified: Secondary | ICD-10-CM | POA: Diagnosis not present

## 2022-09-09 DIAGNOSIS — K219 Gastro-esophageal reflux disease without esophagitis: Secondary | ICD-10-CM

## 2022-09-09 DIAGNOSIS — R7309 Other abnormal glucose: Secondary | ICD-10-CM | POA: Diagnosis not present

## 2022-09-09 DIAGNOSIS — I10 Essential (primary) hypertension: Secondary | ICD-10-CM

## 2022-09-09 DIAGNOSIS — Z1231 Encounter for screening mammogram for malignant neoplasm of breast: Secondary | ICD-10-CM | POA: Insufficient documentation

## 2022-09-09 NOTE — Assessment & Plan Note (Signed)
Without concern for fatigue or pallor; no ecchymosis noted Repeat CBC with diff; patient is off iron at this time

## 2022-09-09 NOTE — Assessment & Plan Note (Signed)
Due for screening for mammogram, denies breast concerns, provided with phone number to call and schedule appointment for mammogram. Encouraged to repeat breast cancer screening every 1-2 years.  

## 2022-09-09 NOTE — Assessment & Plan Note (Signed)
Chronic, stable with use of trazodone nightly at 50 mg

## 2022-09-09 NOTE — Assessment & Plan Note (Signed)
Recommend A1c given previous elevated glucose Continue to recommend balanced, lower carb meals. Smaller meal size, adding snacks. Choosing water as drink of choice and increasing purposeful exercise.

## 2022-09-09 NOTE — Assessment & Plan Note (Signed)
Chronic, gerd without esophagus involvement Remains on low dose prilosec at 20 mg Denies current concerns

## 2022-09-09 NOTE — Assessment & Plan Note (Signed)
Chronic, well controlled Remains off medications at this time; SBPs in 120s Repeat CBC and CMP Followed by cardiology, [facility]

## 2022-09-09 NOTE — Assessment & Plan Note (Signed)
Chronic, off medication Continues to note some mood difficulties as well as sleep disturbance Repeat labs Vitals, weight stable Pt reports mood as stable

## 2022-09-09 NOTE — Patient Instructions (Addendum)
The CDC recommends two doses of Shingrix (the new shingles vaccine) separated by 2 to 6 months for adults age 74 years and older. I recommend checking with your insurance plan regarding coverage for this vaccine.    Please call and schedule your mammogram and bone scan- DEXA.  [facility]  [address], [address] [facility] [place],  [place]  [address] Get Driving Directions Main: [phone]  Sunday:Closed Monday:7:20 AM - 5:00 PM Tuesday:7:20 AM - 5:00 PM Wednesday:7:20 AM - 5:00 PM Thursday:7:20 AM - 5:00 PM Friday:7:20 AM - 4:30 PM Saturday:Closed

## 2022-09-09 NOTE — Assessment & Plan Note (Signed)
Chronic, well controlled with use of both zoloft 100 as well as effexor 150 and low dose klonopin; provided by psych PDMP reviewed

## 2022-09-09 NOTE — Assessment & Plan Note (Signed)
    09/09/2022    9:03 AM 04/29/2022    9:18 AM 04/04/2021    9:42 AM  PHQ9 SCORE ONLY  PHQ-9 Total Score 0 0 0   Chronic, recurrent, well controlled Remains on both zoloft at 100 as well as effexor 150 mg as managed by psych Denies concern for SI or HI

## 2022-09-09 NOTE — Assessment & Plan Note (Signed)
LDL previously elevated despite use of crestor 5 mg Repeat LP LDL goal of <100 without known ASCVD or DM; A1c pending at this time  recommend diet low in saturated fat and regular exercise - 30 min at least 5 times per week

## 2022-09-09 NOTE — Assessment & Plan Note (Signed)
Due for repeat DEXA given known OP on fosamax 70

## 2022-09-10 ENCOUNTER — Other Ambulatory Visit: Payer: Self-pay | Admitting: Family Medicine

## 2022-09-10 DIAGNOSIS — R42 Dizziness and giddiness: Secondary | ICD-10-CM | POA: Diagnosis not present

## 2022-09-10 LAB — CBC WITH DIFFERENTIAL/PLATELET
Basophils Absolute: 0.1 10*3/uL (ref 0.0–0.2)
Basos: 2 %
EOS (ABSOLUTE): 0.2 10*3/uL (ref 0.0–0.4)
Eos: 3 %
Hematocrit: 35.8 % (ref 34.0–46.6)
Hemoglobin: 12.1 g/dL (ref 11.1–15.9)
Immature Grans (Abs): 0 10*3/uL (ref 0.0–0.1)
Immature Granulocytes: 0 %
Lymphocytes Absolute: 1.8 10*3/uL (ref 0.7–3.1)
Lymphs: 30 %
MCH: 32.5 pg (ref 26.6–33.0)
MCHC: 33.8 g/dL (ref 31.5–35.7)
MCV: 96 fL (ref 79–97)
Monocytes Absolute: 0.8 10*3/uL (ref 0.1–0.9)
Monocytes: 13 %
Neutrophils Absolute: 3.1 10*3/uL (ref 1.4–7.0)
Neutrophils: 52 %
Platelets: 240 10*3/uL (ref 150–450)
RBC: 3.72 x10E6/uL — ABNORMAL LOW (ref 3.77–5.28)
RDW: 12.2 % (ref 11.7–15.4)
WBC: 5.8 10*3/uL (ref 3.4–10.8)

## 2022-09-10 LAB — COMPREHENSIVE METABOLIC PANEL
ALT: 11 IU/L (ref 0–32)
AST: 18 IU/L (ref 0–40)
Albumin/Globulin Ratio: 2.3 — ABNORMAL HIGH (ref 1.2–2.2)
Albumin: 4.3 g/dL (ref 3.8–4.8)
Alkaline Phosphatase: 64 IU/L (ref 44–121)
BUN/Creatinine Ratio: 15 (ref 12–28)
BUN: 13 mg/dL (ref 8–27)
Bilirubin Total: 0.2 mg/dL (ref 0.0–1.2)
CO2: 22 mmol/L (ref 20–29)
Calcium: 10 mg/dL (ref 8.7–10.3)
Chloride: 101 mmol/L (ref 96–106)
Creatinine, Ser: 0.85 mg/dL (ref 0.57–1.00)
Globulin, Total: 1.9 g/dL (ref 1.5–4.5)
Glucose: 94 mg/dL (ref 70–99)
Potassium: 3.7 mmol/L (ref 3.5–5.2)
Sodium: 141 mmol/L (ref 134–144)
Total Protein: 6.2 g/dL (ref 6.0–8.5)
eGFR: 72 mL/min/{1.73_m2} (ref >59)

## 2022-09-10 LAB — LIPID PANEL
Chol/HDL Ratio: 4.2 ratio (ref 0.0–4.4)
Cholesterol, Total: 186 mg/dL (ref 100–199)
HDL: 44 mg/dL (ref >39)
LDL Chol Calc (NIH): 120 mg/dL — ABNORMAL HIGH (ref 0–99)
Triglycerides: 121 mg/dL (ref 0–149)
VLDL Cholesterol Cal: 22 mg/dL (ref 5–40)

## 2022-09-10 LAB — TSH+FREE T4
Free T4: 1.26 ng/dL (ref 0.82–1.77)
TSH: 4.55 u[IU]/mL — ABNORMAL HIGH (ref 0.450–4.500)

## 2022-09-10 LAB — HEMOGLOBIN A1C
Est. average glucose Bld gHb Est-mCnc: 111 mg/dL
Hgb A1c MFr Bld: 5.5 % (ref 4.8–5.6)

## 2022-09-10 MED ORDER — ROSUVASTATIN CALCIUM 20 MG PO TABS: 20.0000 mg | ORAL_TABLET | Freq: Every day | ORAL | 3 refills | Status: AC

## 2022-09-10 NOTE — Progress Notes (Signed)
Labs have been reviewed and are stable; elevation remains in bad/LDL cholesterol. I continue to recommend diet low in saturated fat and regular exercise - 30 min at least 5 times per week  Risk of heart attack and/or stroke is moderate at 13%. The 10-year ASCVD risk score (Arnett DK, et al., 2019) is: 12.9%. Recommend increase in Crestor to 20 mg daily to assist.   Values used to calculate the score:     Age: 72 years     Sex: Female     Is Non-Hispanic African American: No     Diabetic: No     Tobacco smoker: No     Systolic Blood Pressure: 127 mmHg     Is BP treated: No     HDL Cholesterol: 44 mg/dL     Total Cholesterol: 186 mg/dL

## 2022-09-18 DIAGNOSIS — F331 Major depressive disorder, recurrent, moderate: Secondary | ICD-10-CM | POA: Diagnosis not present

## 2022-09-18 DIAGNOSIS — F410 Panic disorder [episodic paroxysmal anxiety] without agoraphobia: Secondary | ICD-10-CM | POA: Diagnosis not present

## 2022-09-18 DIAGNOSIS — F411 Generalized anxiety disorder: Secondary | ICD-10-CM | POA: Diagnosis not present

## 2022-11-01 ENCOUNTER — Other Ambulatory Visit: Payer: Self-pay | Admitting: Family Medicine

## 2022-11-01 DIAGNOSIS — L255 Unspecified contact dermatitis due to plants, except food: Secondary | ICD-10-CM

## 2022-11-03 NOTE — Telephone Encounter (Signed)
Requested Prescriptions  Pending Prescriptions Disp Refills   famotidine (PEPCID) 40 MG tablet [Pharmacy Med Name: FAMOTIDINE 40MG  TABLETS] 90 tablet 2    Sig: TAKE 1 TABLET(40 MG) BY MOUTH DAILY     Gastroenterology:  H2 Antagonists Passed - 11/01/2022  6:33 AM      Passed - Valid encounter within last 12 months    Recent Outpatient Visits           1 month ago Adult hypothyroidism   [facility] Merita Norton T, FNP   1 year ago Dermatitis due to plants, including poison ivy, sumac, and oak   [facility] Merita Norton T, FNP   1 year ago Hypertension, unspecified type   [facility] Bosie Clos, MD   1 year ago Acute non-recurrent pansinusitis   [facility] Alfredia Ferguson, PA-C   1 year ago Annual physical exam   [facility] Bosie Clos, MD

## 2022-11-24 ENCOUNTER — Ambulatory Visit
Admission: RE | Admit: 2022-11-24 | Discharge: 2022-11-24 | Disposition: A | Discharge status: Home / Self Care | Payer: Medicare HMO | Source: Ambulatory Visit | Attending: Family Medicine | Admitting: Family Medicine

## 2022-11-24 DIAGNOSIS — M810 Age-related osteoporosis without current pathological fracture: Secondary | ICD-10-CM | POA: Diagnosis not present

## 2022-11-24 DIAGNOSIS — Z780 Asymptomatic menopausal state: Secondary | ICD-10-CM | POA: Diagnosis not present

## 2022-11-24 DIAGNOSIS — Z1231 Encounter for screening mammogram for malignant neoplasm of breast: Secondary | ICD-10-CM

## 2022-11-24 NOTE — Progress Notes (Signed)
Hi Melanie Palmer  Normal mammogram; repeat in 1 year.  Please let us know if you have any questions.  Thank you,  Jashiya Bassett, FNP 

## 2022-11-24 NOTE — Progress Notes (Signed)
Osteoporosis;decrease bone strength noted. Can use Vit D 800 IU and Calcium 1200 mg to assist regular exercise for bone health. Can repeat DEXA in 2 years if desired.

## 2022-12-15 DIAGNOSIS — M25362 Other instability, left knee: Secondary | ICD-10-CM | POA: Diagnosis not present

## 2022-12-15 DIAGNOSIS — R569 Unspecified convulsions: Secondary | ICD-10-CM | POA: Diagnosis not present

## 2022-12-19 DIAGNOSIS — M25562 Pain in left knee: Secondary | ICD-10-CM | POA: Diagnosis not present

## 2022-12-19 DIAGNOSIS — M1712 Unilateral primary osteoarthritis, left knee: Secondary | ICD-10-CM | POA: Diagnosis not present

## 2022-12-19 DIAGNOSIS — M1711 Unilateral primary osteoarthritis, right knee: Secondary | ICD-10-CM | POA: Diagnosis not present

## 2022-12-19 DIAGNOSIS — M25561 Pain in right knee: Secondary | ICD-10-CM | POA: Diagnosis not present

## 2022-12-31 DIAGNOSIS — M170 Bilateral primary osteoarthritis of knee: Secondary | ICD-10-CM | POA: Diagnosis not present

## 2023-01-12 DIAGNOSIS — H2513 Age-related nuclear cataract, bilateral: Secondary | ICD-10-CM | POA: Diagnosis not present

## 2023-01-12 DIAGNOSIS — Z0100 Encounter for examination of eyes and vision without abnormal findings: Secondary | ICD-10-CM | POA: Diagnosis not present

## 2023-01-12 DIAGNOSIS — H401131 Primary open-angle glaucoma, bilateral, mild stage: Secondary | ICD-10-CM | POA: Diagnosis not present

## 2023-01-13 DIAGNOSIS — F411 Generalized anxiety disorder: Secondary | ICD-10-CM | POA: Diagnosis not present

## 2023-01-13 DIAGNOSIS — F331 Major depressive disorder, recurrent, moderate: Secondary | ICD-10-CM | POA: Diagnosis not present

## 2023-01-13 DIAGNOSIS — F410 Panic disorder [episodic paroxysmal anxiety] without agoraphobia: Secondary | ICD-10-CM | POA: Diagnosis not present

## 2023-03-19 DIAGNOSIS — G3184 Mild cognitive impairment, so stated: Secondary | ICD-10-CM | POA: Diagnosis not present

## 2023-03-19 DIAGNOSIS — Z87898 Personal history of other specified conditions: Secondary | ICD-10-CM | POA: Diagnosis not present

## 2023-03-19 DIAGNOSIS — Z8659 Personal history of other mental and behavioral disorders: Secondary | ICD-10-CM | POA: Diagnosis not present

## 2023-03-19 DIAGNOSIS — R569 Unspecified convulsions: Secondary | ICD-10-CM | POA: Diagnosis not present

## 2023-03-19 DIAGNOSIS — R296 Repeated falls: Secondary | ICD-10-CM | POA: Diagnosis not present

## 2023-05-05 DIAGNOSIS — G3184 Mild cognitive impairment, so stated: Secondary | ICD-10-CM | POA: Diagnosis not present

## 2023-05-05 DIAGNOSIS — Z23 Encounter for immunization: Secondary | ICD-10-CM | POA: Diagnosis not present

## 2023-05-05 DIAGNOSIS — F330 Major depressive disorder, recurrent, mild: Secondary | ICD-10-CM | POA: Diagnosis not present

## 2023-05-05 DIAGNOSIS — F411 Generalized anxiety disorder: Secondary | ICD-10-CM | POA: Diagnosis not present

## 2023-05-05 DIAGNOSIS — E039 Hypothyroidism, unspecified: Secondary | ICD-10-CM | POA: Diagnosis not present

## 2023-05-18 DIAGNOSIS — F331 Major depressive disorder, recurrent, moderate: Secondary | ICD-10-CM | POA: Diagnosis not present

## 2023-05-18 DIAGNOSIS — F411 Generalized anxiety disorder: Secondary | ICD-10-CM | POA: Diagnosis not present

## 2023-05-18 DIAGNOSIS — F5105 Insomnia due to other mental disorder: Secondary | ICD-10-CM | POA: Diagnosis not present

## 2023-05-18 DIAGNOSIS — F410 Panic disorder [episodic paroxysmal anxiety] without agoraphobia: Secondary | ICD-10-CM | POA: Diagnosis not present

## 2023-05-22 DIAGNOSIS — E039 Hypothyroidism, unspecified: Secondary | ICD-10-CM | POA: Diagnosis not present

## 2023-05-27 ENCOUNTER — Other Ambulatory Visit: Payer: Self-pay | Admitting: Family Medicine

## 2023-05-27 DIAGNOSIS — K219 Gastro-esophageal reflux disease without esophagitis: Secondary | ICD-10-CM

## 2023-06-30 DIAGNOSIS — H401131 Primary open-angle glaucoma, bilateral, mild stage: Secondary | ICD-10-CM | POA: Diagnosis not present

## 2023-06-30 DIAGNOSIS — H2513 Age-related nuclear cataract, bilateral: Secondary | ICD-10-CM | POA: Diagnosis not present

## 2023-07-16 DIAGNOSIS — F0280 Dementia in other diseases classified elsewhere without behavioral disturbance: Secondary | ICD-10-CM | POA: Diagnosis not present

## 2023-07-16 DIAGNOSIS — R296 Repeated falls: Secondary | ICD-10-CM | POA: Diagnosis not present

## 2023-07-16 DIAGNOSIS — F0150 Vascular dementia without behavioral disturbance: Secondary | ICD-10-CM | POA: Diagnosis not present

## 2023-07-16 DIAGNOSIS — Z8659 Personal history of other mental and behavioral disorders: Secondary | ICD-10-CM | POA: Diagnosis not present

## 2023-07-16 DIAGNOSIS — G309 Alzheimer's disease, unspecified: Secondary | ICD-10-CM | POA: Diagnosis not present

## 2023-07-31 ENCOUNTER — Telehealth: Payer: Self-pay

## 2023-07-31 NOTE — Telephone Encounter (Signed)
Not a patient at [facility] 

## 2023-07-31 NOTE — Telephone Encounter (Signed)
[facility] pharmacy faxed refill request for the following medications:   famotidine (PEPCID) 40 MG tablet    Please advise

## 2023-09-24 ENCOUNTER — Telehealth: Payer: Self-pay | Admitting: Family Medicine

## 2023-09-24 NOTE — Telephone Encounter (Signed)
 prescribing provider no longer with practice - pharmacy called to request. Advised pt may need appt. They will make a note.    Copied from CRM [id number]. Topic: Clinical - Medication Refill >> Sep 24, 2023  9:29 AM Crispin Dolphin wrote: Medication: rosuvastatin  (CRESTOR ) 20 MG tablet -  Has the patient contacted their pharmacy? Yes - pharmacy made request  (Agent: If no, request that the patient contact the pharmacy for the refill. If patient does not wish to contact the pharmacy document the reason why and proceed with request.) (Agent: If yes, when and what did the pharmacy advise?)  This is the patient's preferred pharmacy:  [id number] - [place], [place] - [address] AT [facility] [address] [facility] [address] [place] [address] Phone: [phone] Fax: [phone]  Is this the correct pharmacy for this prescription? Yes If no, delete pharmacy and type the correct one.   Has the prescription been filled recently? No  Is the patient out of the medication? N/A  Has the patient been seen for an appointment in the last year OR does the patient have an upcoming appointment? No  Can we respond through MyChart? No  Agent: Please be advised that Rx refills may take up to 3 business days. We ask that you follow-up with your pharmacy.

## 2023-09-24 NOTE — Telephone Encounter (Signed)
 [facility] called and spoke to [place], [facility]. Advised this patient is no longer at [facility] that her provider Dr. Oletta Berry is at [facility], phone number provided, advised to call that location for the refill. She says the last time it was refilled by Iona Manis, NP. Advised she's no longer here and that the patient saw Dr. Oletta Berry in December and January at [facility]. She verbalized understanding.

## 2023-11-09 DIAGNOSIS — F0280 Dementia in other diseases classified elsewhere without behavioral disturbance: Secondary | ICD-10-CM | POA: Diagnosis not present

## 2023-11-09 DIAGNOSIS — F0150 Vascular dementia without behavioral disturbance: Secondary | ICD-10-CM | POA: Diagnosis not present

## 2023-11-09 DIAGNOSIS — G309 Alzheimer's disease, unspecified: Secondary | ICD-10-CM | POA: Diagnosis not present

## 2023-11-09 DIAGNOSIS — Z1331 Encounter for screening for depression: Secondary | ICD-10-CM | POA: Diagnosis not present

## 2023-11-09 DIAGNOSIS — Z8659 Personal history of other mental and behavioral disorders: Secondary | ICD-10-CM | POA: Diagnosis not present

## 2023-11-09 DIAGNOSIS — G3184 Mild cognitive impairment, so stated: Secondary | ICD-10-CM | POA: Diagnosis not present

## 2023-12-24 DIAGNOSIS — F410 Panic disorder [episodic paroxysmal anxiety] without agoraphobia: Secondary | ICD-10-CM | POA: Diagnosis not present

## 2023-12-24 DIAGNOSIS — F331 Major depressive disorder, recurrent, moderate: Secondary | ICD-10-CM | POA: Diagnosis not present

## 2023-12-24 DIAGNOSIS — F411 Generalized anxiety disorder: Secondary | ICD-10-CM | POA: Diagnosis not present

## 2023-12-24 DIAGNOSIS — F5105 Insomnia due to other mental disorder: Secondary | ICD-10-CM | POA: Diagnosis not present

## 2024-01-05 DIAGNOSIS — H401131 Primary open-angle glaucoma, bilateral, mild stage: Secondary | ICD-10-CM | POA: Diagnosis not present

## 2024-01-05 DIAGNOSIS — Z0100 Encounter for examination of eyes and vision without abnormal findings: Secondary | ICD-10-CM | POA: Diagnosis not present

## 2024-01-05 DIAGNOSIS — H2513 Age-related nuclear cataract, bilateral: Secondary | ICD-10-CM | POA: Diagnosis not present

## 2024-03-10 DIAGNOSIS — E785 Hyperlipidemia, unspecified: Secondary | ICD-10-CM | POA: Diagnosis not present

## 2024-03-10 DIAGNOSIS — Z0000 Encounter for general adult medical examination without abnormal findings: Secondary | ICD-10-CM | POA: Diagnosis not present

## 2024-03-10 DIAGNOSIS — F411 Generalized anxiety disorder: Secondary | ICD-10-CM | POA: Diagnosis not present

## 2024-03-10 DIAGNOSIS — Z23 Encounter for immunization: Secondary | ICD-10-CM | POA: Diagnosis not present

## 2024-03-10 DIAGNOSIS — L209 Atopic dermatitis, unspecified: Secondary | ICD-10-CM | POA: Diagnosis not present

## 2024-03-10 DIAGNOSIS — G3184 Mild cognitive impairment, so stated: Secondary | ICD-10-CM | POA: Diagnosis not present

## 2024-03-10 DIAGNOSIS — Z1331 Encounter for screening for depression: Secondary | ICD-10-CM | POA: Diagnosis not present

## 2024-03-16 ENCOUNTER — Ambulatory Visit

## 2024-03-16 DIAGNOSIS — L219 Seborrheic dermatitis, unspecified: Secondary | ICD-10-CM | POA: Diagnosis not present

## 2024-03-16 MED ORDER — KETOCONAZOLE 2 % EX CREA
TOPICAL_CREAM | CUTANEOUS | 5 refills | Status: AC
Start: 2024-03-16 — End: ?

## 2024-03-16 MED ORDER — KETOCONAZOLE 2 % EX SHAM: MEDICATED_SHAMPOO | CUTANEOUS | 5 refills | Status: AC

## 2024-03-16 MED ORDER — TACROLIMUS 0.1 % EX OINT
TOPICAL_OINTMENT | CUTANEOUS | 5 refills | Status: AC
Start: 2024-03-16 — End: ?

## 2024-03-16 MED ORDER — CLOBETASOL PROPIONATE 0.05 % EX SOLN: CUTANEOUS | 5 refills | Status: AC

## 2024-03-16 NOTE — Progress Notes (Signed)
    Subjective   Melanie Palmer is a 73 y.o. female who presents for the following: Dry skin. Patient is new patient.   Today patient reports: Patient reports dry skin started several years back, worse at scalp, eyebrows and face stay flakey. Patient currently using head and shoulders.  Review of Systems:    No other skin or systemic complaints except as noted in HPI or Assessment and Plan.  The following portions of the chart were reviewed this encounter and updated as appropriate: medications, allergies, medical history  Relevant Medical History:  n/a   Objective  Well appearing patient in no apparent distress; mood and affect are within normal limits. Examination was performed of the: Focused Exam of: Scalp, face   Examination notable for: Seborrheic Dermatitis: Erythema and scaling of the scalp, ear canals, and central face > rest of face.  Examination limited by: Undergarments, Shoes or socks , Clothing, and Patient deferred removal       Assessment & Plan   Seborrheic dermatitis Chronic and persistent condition with duration or expected duration over one year. Condition is symptomatic and bothersome to patient. Patient is flaring and not currently at treatment goal.  - Discussed diagnosis, typical course, and treatment options for this condition - Explained to the patient the chronic nature of this diagnosis - Start ketoconazole cream 2% BID to face   Start tacrolimus ointment 1% twice daily on face Educated about black box warning (and also that recent studies show no increased malignancy risk) and common adverse effects such as burning with application (advised to cool in fridge and only apply to bone-dry skin). - Start clobetasol solution 0.05% twice daily to affected scalp prn flares.   Discussed side effect of super potent topical steroids including atrophy, dyspigmentation, striae, telangectasia, folliculitis, loss of skin pigment, hair growth, tachyphylaxis, risk of  systemic absorption with missuse. - Start ketoconazole 2% shampoo TIW, apply to scalp and affected areas of face/body and allow the shampoo to sit for 5-10 minutes before rinsing off - Consider alternating with use of Head and Shoulders or Selsun Blue anti-dandruff shampoo which contains zinc pyrithione 1%    Level of service outlined above   Procedures, orders, diagnosis for this visit:    There are no diagnoses linked to this encounter.  Return to clinic: Return in about 6 weeks (around 04/27/2024) for Briant Domino, w/ Dr. Raymund.  I, Jacquelynn V. Wilfred, CMA, am acting as scribe for Lauraine JAYSON Raymund, MD.  Documentation: I have reviewed the above documentation for accuracy and completeness, and I agree with the above.  Lauraine JAYSON Raymund, MD

## 2024-03-16 NOTE — Patient Instructions (Addendum)
 Face:  - Start ketoconazole cream 2% BID. -Start tacrolimus ointment 1% twice daily on face.   Scalp  - Start clobetasol solution 0.05% twice daily to affected scalp prn flares. Discussed side effect of super potent topical steroids including atrophy, dyspigmentation, striae, telangectasia, folliculitis, loss of skin pigment, hair growth, tachyphylaxis, risk of systemic absorption with missuse.  - Start ketoconazole 2% shampoo TIW, apply to scalp and affected areas of face/body and allow the shampoo to sit for 5-10 minutes before rinsing off - Consider alternating with use of Head and Shoulders or [place] Blue anti-dandruff shampoo which contains zinc pyrithione 1%   Due to recent changes in healthcare laws, you may see results of your pathology and/or laboratory studies on MyChart before the doctors have had a chance to review them. We understand that in some cases there may be results that are confusing or concerning to you. Please understand that not all results are received at the same time and often the doctors may need to interpret multiple results in order to provide you with the best plan of care or course of treatment. Therefore, we ask that you please give us  2 business days to thoroughly review all your results before contacting the office for clarification. Should we see a critical lab result, you will be contacted sooner.   If You Need Anything After Your Visit  If you have any questions or concerns for your doctor, please call our main line at [phone] and press option 4 to reach your doctor's medical assistant. If no one answers, please leave a voicemail as directed and we will return your call as soon as possible. Messages left after 4 pm will be answered the following business day.   You may also send us  a message via MyChart. We typically respond to MyChart messages within 1-2 business days.  For prescription refills, please ask your pharmacy to contact our office. Our fax  number is [phone].  If you have an urgent issue when the clinic is closed that cannot wait until the next business day, you can page your doctor at the number below.    Please note that while we do our best to be available for urgent issues outside of office hours, we are not available 24/7.   If you have an urgent issue and are unable to reach us , you may choose to seek medical care at your doctor's office, retail clinic, urgent care center, or emergency room.  If you have a medical emergency, please immediately call 911 or go to the emergency department.  Pager Numbers  - Dr. Hester: [phone]  - Dr. Jackquline: [phone]  - Dr. Claudene: [phone]   In the event of inclement weather, please call our main line at [phone] for an update on the status of any delays or closures.  Dermatology Medication Tips: Please keep the boxes that topical medications come in in order to help keep track of the instructions about where and how to use these. Pharmacies typically print the medication instructions only on the boxes and not directly on the medication tubes.   If your medication is too expensive, please contact our office at [phone] option 4 or send us  a message through MyChart.   We are unable to tell what your co-pay for medications will be in advance as this is different depending on your insurance coverage. However, we may be able to find a substitute medication at lower cost or fill out paperwork to get insurance to cover a needed  medication.   If a prior authorization is required to get your medication covered by your insurance company, please allow us  1-2 business days to complete this process.  Drug prices often vary depending on where the prescription is filled and some pharmacies may offer cheaper prices.  The website www.goodrx.com contains coupons for medications through different pharmacies. The prices here do not account for what the cost may be with help  from insurance (it may be cheaper with your insurance), but the website can give you the price if you did not use any insurance.  - You can print the associated coupon and take it with your prescription to the pharmacy.  - You may also stop by our office during regular business hours and pick up a GoodRx coupon card.  - If you need your prescription sent electronically to a different pharmacy, notify our office through [facility] or by phone at [phone] option 4.     Si Usted Necesita Algo Despus de Su Visita  Tambin puede enviarnos un mensaje a travs de Clinical Cytogeneticist. Por lo general respondemos a los mensajes de MyChart en el transcurso de 1 a 2 das hbiles.  Para renovar recetas, por favor pida a su farmacia que se ponga en contacto con nuestra oficina. Randi lakes de fax es [place] [phone].  Si tiene un asunto urgente cuando la clnica est cerrada y que no puede esperar hasta el siguiente da hbil, puede llamar/localizar a su doctor(a) al nmero que aparece a continuacin.   Por favor, tenga en cuenta que aunque hacemos todo lo posible para estar disponibles para asuntos urgentes fuera del horario de [place], no estamos disponibles las 24 horas del da, los 7 [address]  [address] de la [place].   Si tiene un problema urgente y no puede comunicarse con nosotros, puede optar por buscar atencin mdica  en el consultorio de su doctor(a), en una clnica privada, en un centro de atencin urgente o en una sala de emergencias.  Si tiene engineer, drilling, por favor llame inmediatamente al 911 o vaya a la sala de emergencias.  Nmeros de bper  - Dr. Hester: [phone]  - Dra. Jackquline: [phone]  - Dr. Claudene: [phone]   En caso de inclemencias del tiempo, por favor llame a landry capes principal al [phone] para una actualizacin sobre el [place] de cualquier retraso o cierre.  Consejos para la medicacin en dermatologa: Por favor, guarde las cajas en las que vienen los  medicamentos de uso tpico para ayudarle a seguir las instrucciones sobre dnde y cmo usarlos. Las farmacias generalmente imprimen las instrucciones del medicamento slo en las cajas y no directamente en los tubos del [place].   Si su medicamento es muy caro, por favor, pngase en contacto con landry rieger llamando al [phone] y presione la opcin 4 o envenos un mensaje a travs de Clinical Cytogeneticist.   No podemos decirle cul ser su copago por los medicamentos por adelantado ya que esto es diferente dependiendo de la cobertura de su seguro. Sin embargo, es posible que podamos encontrar un medicamento sustituto a audiological scientist un formulario para que el seguro cubra el medicamento que se considera necesario.   Si se requiere una autorizacin previa para que su compaa de seguros cubra su medicamento, por favor permtanos de 1 a 2 das hbiles para completar este proceso.  Los precios de los medicamentos varan con frecuencia dependiendo del environmental consultant de dnde se surte la receta y alguna farmacias pueden ofrecer precios ms baratos.  El sitio web www.goodrx.com tiene cupones para medicamentos de health and safety inspector. Los precios aqu no tienen en cuenta lo que podra costar con la ayuda del seguro (puede ser ms barato con su seguro), pero el sitio web puede darle el precio si no utiliz tourist information centre manager.  - Puede imprimir el cupn correspondiente y llevarlo con su receta a la farmacia.  - Tambin puede pasar por nuestra oficina durante el horario de atencin regular y education officer, museum una tarjeta de cupones de GoodRx.  - Si necesita que su receta se enve electrnicamente a una farmacia diferente, informe a nuestra oficina a travs de MyChart de [place] o por telfono llamando al [phone] y presione la opcin 4.

## 2024-04-07 DIAGNOSIS — M170 Bilateral primary osteoarthritis of knee: Secondary | ICD-10-CM | POA: Diagnosis not present

## 2024-04-27 ENCOUNTER — Ambulatory Visit
